# Patient Record
Sex: Male | Born: 1965 | Hispanic: Yes | Marital: Single | Smoking: Current every day smoker
Health system: Southern US, Community
[De-identification: ages and names within clinical notes are randomized; demographics above are authoritative.]

## PROBLEM LIST (undated history)

## (undated) DIAGNOSIS — Z5900 Homelessness unspecified: Secondary | ICD-10-CM

## (undated) DIAGNOSIS — F1010 Alcohol abuse, uncomplicated: Secondary | ICD-10-CM

---

## 2002-12-26 ENCOUNTER — Emergency Department (HOSPITAL_COMMUNITY): Admission: EM | Admit: 2002-12-26 | Discharge: 2002-12-27 | Payer: Self-pay | Admitting: Emergency Medicine

## 2007-08-22 ENCOUNTER — Emergency Department (HOSPITAL_COMMUNITY): Admission: EM | Admit: 2007-08-22 | Discharge: 2007-08-23 | Payer: Self-pay | Admitting: Emergency Medicine

## 2007-08-31 ENCOUNTER — Inpatient Hospital Stay (HOSPITAL_COMMUNITY): Admission: EM | Admit: 2007-08-31 | Discharge: 2007-09-03 | Payer: Self-pay | Admitting: Emergency Medicine

## 2010-06-03 NOTE — H&P (Signed)
NAMEGARRON, Jay [name]               ACCOUNT NO.:  [id number]   MEDICAL RECORD NO.:  [id number]          PATIENT TYPE:  INP   LOCATION:  1442                         FACILITY:  [facility]   PHYSICIAN:  Mobolaji B. Bakare, M.D.DATE OF BIRTH:  07/11/1965   DATE OF ADMISSION:  08/31/2007  DATE OF DISCHARGE:                              HISTORY & PHYSICAL   PRIMARY CARE PHYSICIAN:  Unassigned.   CHIEF COMPLAINT:  Too much alcohol.   HISTORY OF PRESENTING COMPLAINT:  Mr. Schreur is a 45 year old Hispanic  male who presents to the emergency room via ambulance.  The patient was  apparently found on [employer] by the EMS.  He was brought with the  chief complaint of abdominal pain in the epigastric area.  He admitted  to having been drinking heavy alcohol.  He has not been vomiting.  No  diarrhea or hematemesis or hematochezia.  The patient has an alcohol  level over 300.  His history is somewhat unreliable.  He tells me he has  not had any vomiting and he states that the reason for coming to the  hospital is because of too much drinking.  It should be noted that the  patient was just released from jail 2 days ago.   The patient had initial EKG done upon arrival which showed ST elevation  in lead V2 and code STEMI was called.  Cardiologist on call, Omar Person ,  ordered repeat EKG and getting cardiac panel.  Repeat EKG  showed no ST elevation in V2 and cardiac panel was normal.  The patient  denies any chest pain prior to arrival in the emergency room or here in  the emergency room.  He denies diaphoresis.  No shortness of breath,  palpitations or tingling sensation.   REVIEW OF SYSTEMS:  No fever, chills, cough, dysuria or urgency or  increased frequency of micturition.  Rest of review of systems as in the  HPI.   PAST MEDICAL HISTORY:  1. Alcohol abuse.  2. Tobacco abuse.   PAST SURGICAL HISTORY:  None.   CURRENT MEDICATIONS:  None.   ALLERGIES:  Denies any drug allergies.   SOCIAL HISTORY:  The patient is a Corporate investment banker.  He is homeless.  He drinks alcohol and smokes cigarettes.   FAMILY HISTORY:  Both parents are in [place].  He cannot recall any  health issues with his the parents or siblings.   PHYSICAL EXAMINATION:  VITAL SIGNS:  Temperature 97.6, blood pressure of  117/78, pulse of 93, respiratory rate of 20, oxygen saturation of 99%.  GENERAL:  On examination the patient is awake.  He is disoriented to  place and time.  He is not tremulous.  He is drowsy but arousable and  able to communicate.  HEENT:  Pupils equal, round and reactive to light.  No elevated JVD.  No  carotid bruit.  Mucous membranes moist.  LUNGS:  Clear clinically to auscultation.  CVS:  S1 and S2 regular.  ABDOMEN:  Not distended, soft, no tenderness..  No palpable  organomegaly.  Bowel sounds present.  EXTREMITIES:  No pedal edema or calf tenderness.  Dorsalis pedis pulses  palpable.  CNS:  No focal neurological deficit.   LABORATORY DATA:  White cells 4.4, hemoglobin 14, hematocrit 40.5,  platelets 215,000, alcohol level 343.  Sodium 143, potassium 3.4,  chloride 108, CO2 24, glucose 106, BUN 13, creatinine 0.99, calcium 8.8,  bilirubin 0.7, AST 71, ALT 86, total protein 6.6, albumin 3.9, lipase  28.  Urine drug screen positive for benzodiazepine and negative for  cocaine and cannabis.  Cardiac markers at the point of care normal  troponin 0.01, CK-MB 1.7, creatinine kinase 2.233.  Second set of  cardiac markers at the point of care also normal.  Abdominal x-ray shows  no acute findings.  Chest x-ray:  No acute findings.   ASSESSMENT AND PLAN:  This patient is a 45 year old Hispanic male who is  apparently homeless and was brought to the emergency room for an  epigastric pain and alcohol intoxication.  Noted to have abnormal EKG.  He will be admitted to telemetry for further evaluation.   ADMISSION DIAGNOSIS:  1. Alcohol intoxication.  2. Abnormal EKG.  3.  Abdominal pain (normal lipase).  4. Abnormal liver enzymes.  5. Tobacco abuse.  6. Homelessness.   PLAN:  Admit to telemetry floor.  Cycle cardiac enzymes.  Continue IV  fluid, normal saline.  Start multivitamin 1 tablet daily, folic acid 1  mg daily, thiamine 100 mg daily,  Protonix 40 mg IV.  The patient is at  risk for withdrawal.  Will watch.  He may need to start alcohol  withdrawal protocol soon.  Will check EKG in the morning.  Continue  aspirin 325 mg p.o. daily.  Check fasting lipid profile and hemoglobin  A1c.  Nicotine patch 31 mg daily.  I will offer counseling.  Will check  hepatitis B and C serology and ultrasound of the biliary tree and  gallbladder.  He had elevated transaminases in keeping with alcohol  pattern.      Mobolaji B. Corky Downs, M.D.  Electronically Signed     MBB/MEDQ  D:  08/31/2007  T:  09/01/2007  Job:  [id number]

## 2010-06-06 NOTE — Discharge Summary (Signed)
Jay [name], Jay [name] NO.:  [id number]   MEDICAL RECORD NO.:  [id number]          PATIENT TYPE:  INP   LOCATION:  1501                         FACILITY:  [facility]   PHYSICIAN:  Lonia Blood, M.D.      DATE OF BIRTH:  Sep 10, 1965   DATE OF ADMISSION:  08/31/2007  DATE OF DISCHARGE:  09/03/2007                               DISCHARGE SUMMARY   PRIMARY CARE PHYSICIAN:  [facility].   DISCHARGE DIAGNOSES:  1. Alcohol intoxication.  2. Alcoholic hepatitis.  3. Tobacco abuse.   DISCHARGE MEDICATIONS:  1. Thiamine 100 mg daily.  2. Folate 1 mg daily.  3. Ativan 1 mg every 8 hours as needed.   DISPOSITION:  The patient was discharged to home after completing the  CIWA protocol, was continued with some Ativan as needed as well as  thiamine folate.  He was counseled extensively about stopping drinking.   PROCEDURE PERFORMED:  During this hospitalization includes a head CT  without contrast that was essentially negative on admission.   CONSULTATIONS:  None.   BRIEF HISTORY AND PHYSICAL:  Please refer to dictated history and  physical by Dr. Jamison Oka.  In short however, the patient is a 45-  year-old Hispanic man that presented with alcohol intoxication.  Patient  is homeless at the time of admission and was found to be obtunded with  an alcohol level of 343.  He also had some mild EKG changes that were  nonspecific.  His urine drug screen showed benzodiazepines.  He has mild  elevation of his LFTs and mild hypokalemia with potassium of 3.4.  His  cardiac enzymes were normal except for a CK of 233.  The patient was  admitted with alcohol intoxication for further management.   HOSPITAL COURSE:  1. Alcohol intoxication.  The patient was placed on the CIWA protocol      using Ativan.  He went through withdrawal symptoms and signs, and      was subsequently discharged in stable condition.  He was given      resources for outpatient alcohol programs as  well as extensive      counseling in the hospital.  2. Alcoholic hepatitis.  The patient was negative for hepatitis B or      C.  It seems like his LFTs were elevated      mainly from alcohol.  He was again counseled extensively.  3. Tobacco abuse.  He was given some nicotine patches and counseled      extensively.  Otherwise the patient has no other medical issues at      the time of discharge.      Lonia Blood, M.D.  Electronically Signed     LG/MEDQ  D:  11/08/2007  T:  11/08/2007  Job:  [id number]

## 2010-10-17 LAB — CBC
Hemoglobin: 14
MCHC: 34.5
MCHC: 34.7
MCV: 97.7
Platelets: 215
Platelets: 272
RDW: 12.9
WBC: 6.3

## 2010-10-17 LAB — COMPREHENSIVE METABOLIC PANEL
ALT: 61 — ABNORMAL HIGH
AST: 34
AST: 44 — ABNORMAL HIGH
AST: 51 — ABNORMAL HIGH
Albumin: 3.1 — ABNORMAL LOW
Albumin: 3.5
Albumin: 3.8
Calcium: 7.5 — ABNORMAL LOW
Calcium: 8.2 — ABNORMAL LOW
Calcium: 9
Chloride: 105
Creatinine, Ser: 0.9
Creatinine, Ser: 0.95
Creatinine, Ser: 0.96
GFR calc Af Amer: >60
GFR calc Af Amer: >60
GFR calc Af Amer: >60
Sodium: 139
Sodium: 140
Total Protein: 5.4 — ABNORMAL LOW
Total Protein: 6.8

## 2010-10-17 LAB — CARDIAC PANEL(CRET KIN+CKTOT+MB+TROPI)
Relative Index: 0.9
Total CK: 163
Troponin I: 0.01
Troponin I: 0.01

## 2010-10-17 LAB — LIPID PANEL
HDL: 31 — ABNORMAL LOW
Triglycerides: 406 — ABNORMAL HIGH
VLDL: UNDETERMINED

## 2010-10-17 LAB — HEPATITIS PANEL, ACUTE
Hep B C IgM: NEGATIVE
Hepatitis B Surface Ag: NEGATIVE

## 2010-10-17 LAB — CK TOTAL AND CKMB (NOT AT ARMC)
CK, MB: 1.7
Relative Index: 0.7

## 2010-10-17 LAB — BASIC METABOLIC PANEL
BUN: 13
CO2: 24
Calcium: 8.8
Creatinine, Ser: 0.99
Glucose, Bld: 106 — ABNORMAL HIGH
Sodium: 143

## 2010-10-17 LAB — RAPID URINE DRUG SCREEN, HOSP PERFORMED
Amphetamines: NOT DETECTED
Barbiturates: NOT DETECTED
Benzodiazepines: NOT DETECTED
Benzodiazepines: POSITIVE — AB
Cocaine: NOT DETECTED
Tetrahydrocannabinol: NOT DETECTED

## 2010-10-17 LAB — POCT CARDIAC MARKERS
CKMB, poc: <1 — ABNORMAL LOW
CKMB, poc: <1 — ABNORMAL LOW
Myoglobin, poc: 54.3

## 2010-10-17 LAB — PROTIME-INR
INR: 1.1
Prothrombin Time: 14.1

## 2010-10-17 LAB — ETHANOL: Alcohol, Ethyl (B): 260 — ABNORMAL HIGH

## 2010-10-17 LAB — LIPASE, BLOOD: Lipase: 28

## 2010-10-17 LAB — HEPATIC FUNCTION PANEL
Albumin: 3.9
Total Bilirubin: 0.7

## 2011-08-11 ENCOUNTER — Encounter (HOSPITAL_COMMUNITY): Payer: Self-pay | Admitting: *Deleted

## 2011-08-11 DIAGNOSIS — R279 Unspecified lack of coordination: Secondary | ICD-10-CM | POA: Insufficient documentation

## 2011-08-11 DIAGNOSIS — F1010 Alcohol abuse, uncomplicated: Secondary | ICD-10-CM | POA: Insufficient documentation

## 2011-08-11 DIAGNOSIS — R404 Transient alteration of awareness: Secondary | ICD-10-CM | POA: Insufficient documentation

## 2011-08-11 NOTE — ED Notes (Signed)
Per EMS pt picked up at gas station; homeless; etoh; slurred speech

## 2011-08-12 ENCOUNTER — Telehealth (HOSPITAL_COMMUNITY): Payer: Self-pay | Admitting: Licensed Clinical Social Worker

## 2011-08-12 ENCOUNTER — Emergency Department (HOSPITAL_COMMUNITY)
Admission: EM | Admit: 2011-08-12 | Discharge: 2011-08-13 | Disposition: A | Discharge status: Other Acute Inpatient Hospital | Payer: Self-pay | Attending: Emergency Medicine | Admitting: Emergency Medicine

## 2011-08-12 DIAGNOSIS — F1010 Alcohol abuse, uncomplicated: Secondary | ICD-10-CM

## 2011-08-12 LAB — COMPREHENSIVE METABOLIC PANEL
BUN: 14 mg/dL (ref 6–23)
CO2: 25 mEq/L (ref 19–32)
Calcium: 8.7 mg/dL (ref 8.4–10.5)
Creatinine, Ser: 0.78 mg/dL (ref 0.50–1.35)
GFR calc Af Amer: >90 mL/min (ref >90)
GFR calc non Af Amer: >90 mL/min (ref >90)
Glucose, Bld: 87 mg/dL (ref 70–99)

## 2011-08-12 LAB — CBC
Hemoglobin: 14 g/dL (ref 13.0–17.0)
MCH: 33.2 pg (ref 26.0–34.0)
MCHC: 35.7 g/dL (ref 30.0–36.0)
MCV: 92.9 fL (ref 78.0–100.0)
RBC: 4.22 MIL/uL (ref 4.22–5.81)

## 2011-08-12 LAB — ETHANOL: Alcohol, Ethyl (B): 121 mg/dL — ABNORMAL HIGH (ref 0–11)

## 2011-08-12 MED ORDER — LORAZEPAM 1 MG PO TABS: 0.0000 mg | ORAL_TABLET | Freq: Two times a day (BID) | ORAL | Status: DC

## 2011-08-12 MED ORDER — IBUPROFEN 600 MG PO TABS: 600.0000 mg | ORAL_TABLET | Freq: Three times a day (TID) | ORAL | Status: DC | PRN

## 2011-08-12 MED ORDER — LORAZEPAM 1 MG PO TABS: 1.0000 mg | ORAL_TABLET | Freq: Four times a day (QID) | ORAL | Status: DC | PRN

## 2011-08-12 MED ORDER — ONDANSETRON HCL 4 MG PO TABS: 4.0000 mg | ORAL_TABLET | Freq: Three times a day (TID) | ORAL | Status: DC | PRN

## 2011-08-12 MED ORDER — FOLIC ACID 1 MG PO TABS
1.0000 mg | ORAL_TABLET | Freq: Every day | ORAL | Status: DC
  Administered 2011-08-12: 1 mg via ORAL
  Filled 2011-08-12: qty 1

## 2011-08-12 MED ORDER — LORAZEPAM 2 MG/ML IJ SOLN: 1.0000 mg | Freq: Four times a day (QID) | INTRAMUSCULAR | Status: DC | PRN

## 2011-08-12 MED ORDER — THIAMINE HCL 100 MG/ML IJ SOLN: 100.0000 mg | Freq: Every day | INTRAMUSCULAR | Status: DC

## 2011-08-12 MED ORDER — ADULT MULTIVITAMIN W/MINERALS CH
1.0000 | ORAL_TABLET | Freq: Every day | ORAL | Status: DC
  Administered 2011-08-12: 1 via ORAL
  Filled 2011-08-12: qty 1

## 2011-08-12 MED ORDER — LORAZEPAM 1 MG PO TABS
0.0000 mg | ORAL_TABLET | Freq: Four times a day (QID) | ORAL | Status: DC
  Administered 2011-08-12: 1 mg via ORAL
  Filled 2011-08-12: qty 1

## 2011-08-12 MED ORDER — VITAMIN B-1 100 MG PO TABS
100.0000 mg | ORAL_TABLET | Freq: Every day | ORAL | Status: DC
  Administered 2011-08-12: 100 mg via ORAL
  Filled 2011-08-12: qty 1

## 2011-08-12 MED ORDER — ALUM & MAG HYDROXIDE-SIMETH 200-200-20 MG/5ML PO SUSP: 30.0000 mL | ORAL | Status: DC | PRN

## 2011-08-12 NOTE — ED Notes (Signed)
Attempted to call report to psych ED to move pt, nurse stated that would call back.

## 2011-08-12 NOTE — ED Notes (Signed)
Pt states he lives on the [address], has no family here,  Closest relatives are in [place],  Pt has ETOH on board

## 2011-08-12 NOTE — BH Assessment (Signed)
Assessment Note   Jay [name] is an 46 y.o. male. Pt arrives to [facility] via EMS from local gas station with alcohol intoxication. Patient's ETOH upon arrival was 267.  Initial history limited by heavy alcohol use and somnolence. However, patient later reported that he homeless and has been for the past month. Patient reports mild depression and has turned to alcohol for comforting. Sts he has no family or friends nearby. He has drank heavily for the past 20 days. Pt drinking 6-7 40oz beers daily. His last drink was last night. Patient drank 6-7 beers. Patient denies withdrawal symptoms at this time. EDP notes that patient reported history of withdrawal seizures. During the assessment patient denies any type of seizure history, DT's, and/or blackouts. He denies drug use. No history of in-pt or out-pt treatment. No SI, HI, and current AVH's. Patient admits to a history of auditory hallucinations (no command). However has not heard voices in 4-5 days. Patient unable to describe auditory hallucinations.   Patient sts that he would like inpatient detox. He appears motivated for treatment. Writer will ask on-coming staff to follow up with placement options ([facility] and/or [facility]).    Axis I: Alcohol Dependence Axis II: Deferred Axis III: No past medical history on file. Axis IV: economic problems, housing problems, occupational problems, other psychosocial or environmental problems, problems related to social environment, problems with access to health care services and problems with primary support group Axis V: 41-50 serious symptoms  Past Medical History: No past medical history on file.  No past surgical history on file.  Family History: No family history on file.  Social History:  does not have a smoking history on file. He does not have any smokeless tobacco history on file. He reports that he drinks alcohol. His drug history not on file.  Additional Social History:  Alcohol / Drug Use Pain  Medications: patient denies Prescriptions: patient denies Over the Counter: patient denies History of alcohol / drug use?: Yes Substance #1 Name of Substance 1: Alcohol-beer 1 - Age of First Use: 8 or 46 yrs old  1 - Amount (size/oz): (6-7) 40oz beers 1 - Frequency: daily for the past 20 days 1 - Duration: 20 days 1 - Last Use / Amount: 08/11/2011; "last night"; pt drank approx. 6-7 beers  CIWA:   COWS:    Allergies: No Known Allergies  Home Medications:  (Not in a hospital admission)  OB/GYN Status:  No LMP for male patient.  General Assessment Data Location of Assessment: [facility] ED Living Arrangements: Other (Comment) (patient is homeless) Can pt return to current living arrangement?: Yes Admission Status: Voluntary Is patient capable of signing voluntary admission?: Yes Transfer from: Acute Hospital Referral Source: Other (pr brought to the ED by EMS)     Risk to self Suicidal Ideation: No Suicidal Intent: No Is patient at risk for suicide?: No Suicidal Plan?: No Access to Means: No What has been your use of drugs/alcohol within the last 12 months?:  (alcohol; no drug use) Previous Attempts/Gestures: No How many times?:  (0) Other Self Harm Risks:  (none reported) Triggers for Past Attempts: Other (Comment) (n/a) Intentional Self Injurious Behavior: None Family Suicide History: No Recent stressful life event(s): Other (Comment) (homeless x1 mo; finances; no supports/local family) Persecutory voices/beliefs?: No Depression: No Depression Symptoms:  (none reported) Substance abuse history and/or treatment for substance abuse?: No Suicide prevention information given to non-admitted patients: Not applicable  Risk to Others Homicidal Ideation: No Thoughts of Harm to Others:  No Current Homicidal Intent: No Current Homicidal Plan: No Access to Homicidal Means: No Identified Victim:  (none reported) History of harm to others?: No Assessment of Violence: None  Noted Violent Behavior Description:  (Patient is calm and cooperative. ) Does patient have access to weapons?: No Criminal Charges Pending?: No Does patient have a court date: No  Psychosis Hallucinations: Auditory ("Sometimes I hear voices last heard voices 3 days ago") Delusions:  (none reported; occas. aud halluc's; no command)  Mental Status Report Appear/Hygiene: Disheveled Eye Contact: Good Motor Activity: Other (Comment) (normal) Speech: Logical/coherent Level of Consciousness: Alert Mood: Other (Comment) (appropriate) Affect: Unable to Assess (flat) Anxiety Level: None Thought Processes: Coherent;Relevant Judgement: Unimpaired Orientation: Person;Place;Time;Situation Obsessive Compulsive Thoughts/Behaviors: None  Cognitive Functioning Concentration: Normal Memory: Recent Intact;Remote Intact IQ: Average Insight: Fair Impulse Control: Fair Appetite: Fair Weight Loss:  (none reported) Weight Gain:  (none reported) Sleep: No Change Total Hours of Sleep:  (2-3 hrs per night) Vegetative Symptoms: None  ADLScreening [facility] Assessment Services) Patient's cognitive ability adequate to safely complete daily activities?: Yes Patient able to express need for assistance with ADLs?: Yes Independently performs ADLs?: Yes  Abuse/Neglect [facility]) Physical Abuse: Denies Verbal Abuse: Denies Sexual Abuse: Denies  Prior Inpatient Therapy Prior Inpatient Therapy: No Prior Therapy Dates:  (n/a) Prior Therapy Facilty/Provider(s):  (n/a) Reason for Treatment:  (n/a)  Prior Outpatient Therapy Prior Outpatient Therapy: No Prior Therapy Dates:  (n/a) Prior Therapy Facilty/Provider(s):  (n/a) Reason for Treatment:  (n/a)  ADL Screening (condition at time of admission) Patient's cognitive ability adequate to safely complete daily activities?: Yes Patient able to express need for assistance with ADLs?: Yes Independently performs ADLs?: Yes Weakness of Legs: None Weakness of  Arms/Hands: None  Home Assistive Devices/Equipment Home Assistive Devices/Equipment: None    Abuse/Neglect Assessment (Assessment to be complete while patient is alone) Physical Abuse: Denies Verbal Abuse: Denies Sexual Abuse: Denies Exploitation of patient/patient's resources: Denies Self-Neglect: Denies Values / Beliefs Cultural Requests During Hospitalization: None Spiritual Requests During Hospitalization: None Consults Spiritual Care Consult Needed: No Social Work Consult Needed: No Merchant navy officer (For Healthcare) Advance Directive: Patient does not have advance directive Nutrition Screen Diet: Regular  Additional Information 1:1 In Past 12 Months?: No CIRT Risk: No Elopement Risk: No Does patient have medical clearance?: Yes     Disposition:  Disposition Disposition of Patient: Other dispositions;Referred to (Pending referral to [facility] and/or [facility])  On Site Evaluation by:   Reviewed with Physician:     Melynda Ripple [facility] 08/12/2011 7:25 PM

## 2011-08-12 NOTE — ED Notes (Signed)
MD at bedside. 

## 2011-08-12 NOTE — ED Notes (Signed)
Pt. Belonging is in locker#37(1bag)

## 2011-08-12 NOTE — ED Provider Notes (Signed)
History     CSN: [id number]  Arrival date & time 08/11/11  2345   First MD Initiated Contact with Patient 08/12/11 0144      Chief Complaint  Patient presents with  . Alcohol Intoxication    (Consider location/radiation/quality/duration/timing/severity/associated sxs/prior treatment) HPI 46 yo male presents to the ER via EMS [place] gas station with alcohol intoxication.  Initial history limited by heavy alcohol use and somnolence.  Pt reports being homeless, no family or friends nearby.  Pt reassess after several hours of observation and sleeping.  Pt more arousable after time, and reports he knows he has a problem with alcohol and would like inpatient help with detox.  Pt denies any medical problems.  He reports remote history of withdrawal seizures, and reports he does get shaky currently when he doesn't drink.  Unknown last prolonged sobriety.  He denies any h/o depression, anxiety, no SI/HI.  History reviewed. No pertinent past medical history.  History reviewed. No pertinent past surgical history.  No family history on file.  History  Substance Use Topics  . Smoking status: Not on file  . Smokeless tobacco: Not on file  . Alcohol Use: Yes      Review of Systems  Unable to perform ROS: Other  alcohol intoxication  Allergies  Review of patient's allergies indicates no known allergies.  Home Medications  No current outpatient prescriptions on file.  BP 129/66  Pulse 75  Temp 98 F (36.7 C) (Oral)  Resp 18  SpO2 97%  Physical Exam  Nursing note and vitals reviewed. Constitutional: He appears well-developed and well-nourished.       somnloent but arousable  HENT:  Head: Normocephalic and atraumatic.  Nose: Nose normal.  Mouth/Throat: Oropharynx is clear and moist.  Eyes: Conjunctivae and EOM are normal. Pupils are equal, round, and reactive to light.       Nystagmus noted  Neck: Normal range of motion. Neck supple. No JVD present. No tracheal deviation  present. No thyromegaly present.  Cardiovascular: Normal rate, regular rhythm, normal heart sounds and intact distal pulses.  Exam reveals no gallop and no friction rub.   No murmur heard. Pulmonary/Chest: Effort normal and breath sounds normal. No stridor. No respiratory distress. He has no wheezes. He has no rales. He exhibits no tenderness.  Abdominal: Soft. Bowel sounds are normal. He exhibits no distension and no mass. There is no tenderness. There is no rebound and no guarding.  Musculoskeletal: Normal range of motion. He exhibits no edema and no tenderness.  Lymphadenopathy:    He has no cervical adenopathy.  Neurological: He has normal reflexes. He exhibits normal muscle tone. Coordination (mild ataxia) abnormal.       somnolent  Skin: Skin is dry. No rash noted. No erythema. No pallor.  Psychiatric: He has a normal mood and affect. His behavior is normal. Judgment and thought content normal.    ED Course  Procedures (including critical care time)  Labs Reviewed  ETHANOL - Abnormal; Notable for the following:    Alcohol, Ethyl (B) 267 (*)     All other components within normal limits  COMPREHENSIVE METABOLIC PANEL - Abnormal; Notable for the following:    Potassium 3.4 (*)     All other components within normal limits  ETHANOL - Abnormal; Notable for the following:    Alcohol, Ethyl (B) 121 (*)     All other components within normal limits  CBC  URINE RAPID DRUG SCREEN (HOSP PERFORMED)  1. Alcohol abuse       MDM  46 yo male with alcohol abuse seeking detox.  Have d/w act team who will see the patient.  Holding orders with CIWA placed.        Olivia Mackie, MD 08/12/11 [phone]

## 2011-08-12 NOTE — ED Notes (Signed)
Pt stating he wants help. States that he would like to go to rehab facility. MD notified.

## 2011-08-12 NOTE — ED Notes (Signed)
Report given to Nicole RN

## 2011-08-12 NOTE — BHH Counselor (Addendum)
Pt has been accepted to [facility] by Dr. Lolly Mustache to room 300-2 EDP has been notified and agrees with plan. Support paperwork has been signed by pt and faxed to [facility].

## 2011-08-12 NOTE — ED Notes (Signed)
Pt attended [facility] ED group facilitated by Wilkie Aye, MDiv  Group focused on sources of strength and pt's worked to [employer] of strength in their lives.  Pt spoke little throughout group, but responded to questions from other members and prompts from facilitator.  Pt described to group history of ETOH and desire to cease drinking.  Pt described having family in [place] who are supportive of him, but having little resources in [place] area.    Belva Crome MDiv, Chaplain

## 2011-08-13 ENCOUNTER — Inpatient Hospital Stay (HOSPITAL_COMMUNITY)
Admission: AD | Admit: 2011-08-13 | Discharge: 2011-08-17 | DRG: 897 | Disposition: A | Discharge status: Home / Self Care | Payer: Federal, State, Local not specified - Other | Source: Ambulatory Visit | Attending: Psychiatry | Admitting: Psychiatry

## 2011-08-13 ENCOUNTER — Encounter (HOSPITAL_COMMUNITY): Payer: Self-pay | Admitting: *Deleted

## 2011-08-13 DIAGNOSIS — F1020 Alcohol dependence, uncomplicated: Secondary | ICD-10-CM | POA: Diagnosis present

## 2011-08-13 DIAGNOSIS — F10939 Alcohol use, unspecified with withdrawal, unspecified: Principal | ICD-10-CM | POA: Diagnosis present

## 2011-08-13 DIAGNOSIS — E876 Hypokalemia: Secondary | ICD-10-CM | POA: Diagnosis present

## 2011-08-13 DIAGNOSIS — F10239 Alcohol dependence with withdrawal, unspecified: Principal | ICD-10-CM | POA: Diagnosis present

## 2011-08-13 MED ORDER — NICOTINE 21 MG/24HR TD PT24
21.0000 mg | MEDICATED_PATCH | Freq: Every day | TRANSDERMAL | Status: DC
  Filled 2011-08-13: qty 1

## 2011-08-13 MED ORDER — CHLORDIAZEPOXIDE HCL 25 MG PO CAPS
25.0000 mg | ORAL_CAPSULE | Freq: Every day | ORAL | Status: AC
  Administered 2011-08-17: 25 mg via ORAL
  Filled 2011-08-13: qty 1

## 2011-08-13 MED ORDER — ACETAMINOPHEN 325 MG PO TABS: 650.0000 mg | ORAL_TABLET | Freq: Four times a day (QID) | ORAL | Status: DC | PRN

## 2011-08-13 MED ORDER — CHLORDIAZEPOXIDE HCL 25 MG PO CAPS: 25.0000 mg | ORAL_CAPSULE | Freq: Four times a day (QID) | ORAL | Status: AC | PRN

## 2011-08-13 MED ORDER — ADULT MULTIVITAMIN W/MINERALS CH
1.0000 | ORAL_TABLET | Freq: Every day | ORAL | Status: DC
  Administered 2011-08-13 – 2011-08-17 (×5): 1 via ORAL
  Filled 2011-08-13 (×6): qty 1

## 2011-08-13 MED ORDER — HYDROXYZINE HCL 25 MG PO TABS: 25.0000 mg | ORAL_TABLET | Freq: Four times a day (QID) | ORAL | Status: AC | PRN

## 2011-08-13 MED ORDER — MAGNESIUM HYDROXIDE 400 MG/5ML PO SUSP: 30.0000 mL | Freq: Every day | ORAL | Status: DC | PRN

## 2011-08-13 MED ORDER — ONDANSETRON 4 MG PO TBDP: 4.0000 mg | ORAL_TABLET | Freq: Four times a day (QID) | ORAL | Status: AC | PRN

## 2011-08-13 MED ORDER — CHLORDIAZEPOXIDE HCL 25 MG PO CAPS
25.0000 mg | ORAL_CAPSULE | ORAL | Status: AC
  Administered 2011-08-16: 25 mg via ORAL
  Filled 2011-08-13 (×3): qty 1

## 2011-08-13 MED ORDER — CHLORDIAZEPOXIDE HCL 25 MG PO CAPS
25.0000 mg | ORAL_CAPSULE | Freq: Four times a day (QID) | ORAL | Status: AC
  Administered 2011-08-13 – 2011-08-14 (×6): 25 mg via ORAL
  Filled 2011-08-13 (×6): qty 1

## 2011-08-13 MED ORDER — ALUM & MAG HYDROXIDE-SIMETH 200-200-20 MG/5ML PO SUSP: 30.0000 mL | ORAL | Status: DC | PRN

## 2011-08-13 MED ORDER — VITAMIN B-1 100 MG PO TABS
100.0000 mg | ORAL_TABLET | Freq: Every day | ORAL | Status: DC
  Administered 2011-08-14 – 2011-08-17 (×4): 100 mg via ORAL
  Filled 2011-08-13 (×5): qty 1

## 2011-08-13 MED ORDER — LOPERAMIDE HCL 2 MG PO CAPS: 2.0000 mg | ORAL_CAPSULE | ORAL | Status: AC | PRN

## 2011-08-13 MED ORDER — CHLORDIAZEPOXIDE HCL 25 MG PO CAPS
25.0000 mg | ORAL_CAPSULE | Freq: Three times a day (TID) | ORAL | Status: AC
  Administered 2011-08-14 – 2011-08-15 (×3): 25 mg via ORAL
  Filled 2011-08-13 (×2): qty 1

## 2011-08-13 MED ORDER — ENSURE COMPLETE PO LIQD
237.0000 mL | Freq: Every day | ORAL | Status: DC
  Administered 2011-08-14: 237 mL via ORAL

## 2011-08-13 MED ORDER — THIAMINE HCL 100 MG/ML IJ SOLN
100.0000 mg | Freq: Once | INTRAMUSCULAR | Status: DC
Start: 2011-08-13 — End: 2011-08-17

## 2011-08-13 NOTE — Progress Notes (Signed)
D: Pt denies SI/HI/AV. Pt is pleasant and cooperative. Pt has an interpretor who comes when he is in group. Pt only complaint was he did not understand what medications he was on.   A: Pt was offered support and encouragement. Pt was given scheduled medications. Pt was encourage to attend groups. RN when pt's interpretor can was able to explain to the pt what medications he is on and what they are for.Q 15 minute checks were done for safety.  R:Pt attends groups. Pt is taking medication. Pt has no complaints.Pt stated he felt good that the interpretor was here and he could get his questions answered. Pt receptive to treatment and safety maintained on unit.

## 2011-08-13 NOTE — Progress Notes (Signed)
08/13/2011         Time: 1500      Group Topic/Focus: The focus of this group is on enhancing the patient's understanding of leisure, barriers to leisure, and the importance of engaging in positive leisure activities upon discharge for improved total health.  Participation Level: None  Participation Quality: Attentive  Affect: Blunted  Cognitive: Oriented   Additional Comments: Patient introduced himself, then sat quietly the remainder of group.   Zahra Peffley 08/13/2011 3:44 PM 

## 2011-08-13 NOTE — Discharge Planning (Signed)
New patient attended AM group; fairly good communication skills in [place].  States he is here due to alcohol dependence.  Went through detox in [place] about a year ago, and was sober for 4 months.  Has no supports here, but has a cousin in [place].  Would like to get to [facility] where he knows he can stay with cousin.  Has no phone number for cousin, has no ID-lost wallet.  As a consequence, unable to work.

## 2011-08-13 NOTE — Progress Notes (Signed)
Brief Nutrition Note  Reason: Nutrition Risk for unintentional weight loss > 10 lb over 1 month.    Patient reported his appetite and intake are better today than they have been. He reported he would only eat two meals a days PTA because he drank a lot of alcohol. He reported he does not wan to drink alcohol anymore.   Wt Readings from Last 10 Encounters:  08/13/11 131 lb 8 oz (59.648 kg)   I have encouraged the patient to eat regular meals daily. We discussed snacks that are good sources of protein. He asked good questions about nutrition and was without any further nutrition related questions. He stated that he would like to try Ensure nutrition supplement.   Will order patient Ensure nutrition supplement once daily. Provides 250 kcal and 9 grams of protein.   Recommend provide patient snack of cheese and crackers or yogurt at snack times.   RD available for nutrition needs.   Iven Finn [facility] [phone]

## 2011-08-13 NOTE — Progress Notes (Signed)
Patient ID: Jay [name], male   DOB: Feb 16, 1965, 46 y.o.   MRN: [id number] This patient went to karaoke with an interpreter tonight.

## 2011-08-13 NOTE — Progress Notes (Signed)
Patient ID: Jay [name], male   DOB: 05/15/1965, 46 y.o.   MRN: [id number]  Pt was pleasant and cooperative during the adm process. Pt is originally from [place] and speaks and understands some [place]. However even though pt stated he had no problems reading or writing;it should be noted that he was not able to read the treatment agreement. When asked to describe the circumstances surrounding his adm pt stated, "I've been drinking too much". Pt admits to drinking six to seven 24 oz beers daily and was found at a gas station, intoxicated. Pt is homeless, and plans to return to [place] to live with cousins. Stated he came to [facility], "looking for work".  Pt has a blister under the great toe on his left foot, and 2nd toe on the right. Stated, "from walking too much".  Pt denies SI, HI, A/V.

## 2011-08-13 NOTE — B[facility] Counselor (Signed)
Adult Comprehensive Assessment  Patient ID: Jay [name], male   DOB: [birth year], 46 y.o.   MRN: [id number]  Information Source: Information source: Patient  Current Stressors:  Educational / Learning stressors: 6th grade education Employment / Job issues: unemployed Family Relationships: most of his family is in [place] Financial / Lack of resources (include bankruptcy): no income Housing / Lack of housing: homeless Physical health (include injuries & life threatening diseases): no issues reported Social relationships: no social support Substance abuse: drinks daily-6 40oz. beers for the last 20 days Bereavement / Loss: none reported  Living/Environment/Situation:  Living Arrangements: Other (Comment) (shelter, friends, and on the streets) Living conditions (as described by patient or guardian): homeless for one month, prior to this, he went from place to place with friends How long has patient lived in current situation?: at least one month What is atmosphere in current home: Dangerous  Family History:  Marital status: Single Does patient have children?: No  Childhood History:  By whom was/is the patient raised?: Mother Additional childhood history information: grew up in [place] where mother still resides Description of patient's relationship with caregiver when they were a child: good Patient's description of current relationship with people who raised him/her: he sometimes calls her Does patient have siblings?: Yes Number of Siblings: 7  (3 brothers, 4 sisters) Description of patient's current relationship with siblings: all in [place], good Did patient suffer any verbal/emotional/physical/sexual abuse as a child?: No Did patient suffer from severe childhood neglect?: No Has patient ever been sexually abused/assaulted/raped as an adolescent or adult?: No Was the patient ever a victim of a crime or a disaster?: No How has this effected patient's relationships?: n/a Witnessed  domestic violence?: No Has patient been effected by domestic violence as an adult?: No  Education:  Highest grade of school patient has completed: 6th grade Currently a student?: No Learning disability?: No  Employment/Work Situation:   Employment situation: Unemployed What is the longest time patient has a held a job?: 1 year 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 beer, for 20 days If attempted suicide, did drugs/alcohol play a role in this?: No Alcohol/Substance Abuse Treatment Hx: Past Tx, Inpatient If yes, describe treatment: in [place], couldn't remember where. A long time ago Has alcohol/substance abuse ever caused legal problems?: No  Social Support System:   Forensic psychologist System: None Type of faith/religion: none How does patient's faith help to cope with current illness?: n/a  Leisure/Recreation:   Leisure and Hobbies: walking all day-shelter at night  Strengths/Needs:   What things does the patient do well?: none identified In what areas does patient struggle / problems for patient: none identified  Discharge Plan:   Does patient have access to transportation?: No Plan for no access to transportation at discharge: bus Will patient be returning to same living situation after discharge?: No Plan for living situation after discharge: may go to live with cousins in [place], but has no phone numbers Currently receiving community mental health services: No If no, would patient like referral for services when discharged?: Yes (What county?) ([place], [place]) Does patient have financial barriers related to discharge medications?: Yes Patient description of barriers related to  discharge medications: no income  Summary/Recommendations:   Summary and Recommendations (to be completed by the evaluator): Patient is a 46 year old male with diagnosis of Alcohol Dependence. Patient was intoxicated at a gas station. He has been depresses related to homelessness and no income. He has been self-medicating by drinking daily 6-7 40 oz . He is requesting detox. Patient would benefit from crisis stabilization, medication evaluation, group therapy, and psychoeducation groups to work on coping skills, case management for referrals.  Heinrich Fertig. 08/13/2011

## 2011-08-13 NOTE — H&P (Signed)
Psychiatric Admission Assessment Adult   Patient Identification:  Jay [name]  Date of Evaluation:  08/13/2011  Chief Complaint:  Alcohol Dependence  History of Present Illness: Pt is a 46 yo unmarried Hispanic male who presented through the [facility] ED for detox off alcohol.  He is currently homeless, unemployed and lost all of his "papers".  Pt has been drinking until complete intoxication daily.  He denied any hx of w/d seizures, complications or DTs.  Denied all other prescription and illicit drug use.  Further denied any acute safety concerns.         Past Psychiatric History: none.  Past Medical History:  No past medical history on file. Pt currently mildly hypokalemic.    Allergies:  No Known Allergies  PTA Medications: No prescriptions prior to admission    Previous Psychotropic Medications: none.  Substance Abuse History in the last 12 months: daily use of alcohol.  Denied any other prescription or illicit drug use.    Consequences of Substance Abuse: lost all of his papers/wallet and has no ID.  Social History:  Unemployed; unmarried; no children; cousin in [place]  Family History:  No family history on file.  Mental Status Examination/Evaluation: Patient seen and evaluated. Chart reviewed. Patient stated that his mood was "good". His affect was mood congruent and euthymic. He denied any current thoughts of self injurious behavior, suicidal ideation or homicidal ideation. He denied any significant depressive signs or symptoms at this time. There were no auditory or visual hallucinations, paranoia, delusional thought processes, or mania noted.  Thought process was linear and goal directed.  No psychomotor agitation or retardation was noted. His speech was normal rate, tone and volume. Eye contact was good. Judgment and insight are fair.  Patient has been up and engaged on the unit.  No acute safety concerns reported from team.  Pt agreeable to detox off alcohol.  No acute medical  or psychiatric issues noted.  Laboratory/X-Ray Psychological Evaluation(s)  See chart.    Assessment:  Alcohol Use & W/D Disorder; Hypokalemia   Treatment Plan/Recommendations: Pt admitted for crisis stabilization, detox off alcohol and treatment.  Please see orders.  Repeat K pending.  Pt seen and evaluated with interpreter.   Medications reviewed with pt and medication education provided. Will continue q15 minute checks per unit protocol.  No clinical indication for one on one level of observation at this time.  Pt contracting for safety.  Mental health treatment, medication management and continued sobriety will mitigate against the potential increased risk of harm to self and/or others.  Discussed the importance of recovery with pt, as well as, tools to move forward in a healthy & safe manner.  Pt agreeable with the plan.  Discussed with the team.   Current Medications:    . chlordiazePOXIDE  25 mg Oral QID   Followed by  . chlordiazePOXIDE  25 mg Oral TID   Followed by  . chlordiazePOXIDE  25 mg Oral BH-qamhs   Followed by  . chlordiazePOXIDE  25 mg Oral Daily  . feeding supplement  237 mL Oral QPC lunch  . multivitamin with minerals  1 tablet Oral Daily  . thiamine  100 mg Intramuscular Once  . thiamine  100 mg Oral Daily     Lupe Carney 7/25/201312:43 PM

## 2011-08-13 NOTE — Progress Notes (Signed)
[facility] Group Notes:  (Counselor/Nursing/MHT/Case Management/Adjunct)  08/13/2011 4:11 PM  Type of Therapy:  Group Therapy from 1:15 to 2:30  Participation Level:  Active  Participation Quality:  Appropriate, Attentive and Sharing  Affect:  Appropriate  Cognitive:  Alert, Appropriate and Oriented  Insight:  Good  Engagement in Group:  Good  Engagement in Therapy:  Good  Modes of Intervention:  Activity, Clarification and Support  Summary of Progress/Problems:  Jay [name]  participated in activity in which patients choose photographs to represent what their life would look and feel like were it in balance and another for out of balance. Jay [name] choose a photo of the interior of a chapel  to represent balance; and a photo of chained, locked doors to represent out of balance.  He shared "the alcohol is like these locked doors which keep me isolated and away from God.  "I don't do well away from God, I loose hope that I can even get back close again; it is a painful way to be."  Patient began group with out an interpreter but presented with more affect once joined by interpreter.   Clide Dales

## 2011-08-13 NOTE — Tx Team (Addendum)
Initial Interdisciplinary Treatment Plan  PATIENT STRENGTHS: (choose at least two) Ability for insight Motivation for treatment/growth Physical Health Work skills  PATIENT STRESSORS: Financial difficulties Substance abuse   PROBLEM LIST: Problem List/Patient Goals Date to be addressed Date deferred Reason deferred Estimated date of resolution  "I need to stop drinking" 08/13/11     "Help to get back to family" 08/13/11           Alcohol abuse 08/13/11     Increased risk for suicide 08/13/11                              DISCHARGE CRITERIA:  Ability to meet basic life and health needs Adequate post-discharge living arrangements Improved stabilization in mood, thinking, and/or behavior Motivation to continue treatment in a less acute level of care Need for constant or close observation no longer present Reduction of life-threatening or endangering symptoms to within safe limits Safe-care adequate arrangements made Verbal commitment to aftercare and medication compliance Withdrawal symptoms are absent or subacute and managed without 24-hour nursing intervention  PRELIMINARY DISCHARGE PLAN: Attend 12-step recovery group Outpatient therapy Participate in family therapy Placement in alternative living arrangements  PATIENT/FAMIILY INVOLVEMENT: This treatment plan has been presented to and reviewed with the patient, Jay [name], and/or family member.  The patient and family have been given the opportunity to ask questions and make suggestions.  [name] 08/13/2011, 2:18 AM

## 2011-08-13 NOTE — Progress Notes (Signed)
Psychoeducational Group Note  Date:  08/13/2011 Time:  1100  Group Topic/Focus:  Overcoming Stress:   The focus of this group is to define stress and help patients assess their triggers.  Participation Level:  Active  Participation Quality:  Appropriate  Affect:  Appropriate  Cognitive:  Appropriate  Insight:  Good  Engagement in Group:  Good  Additional Comments:  none  Ormond Lazo M 08/13/2011, 1:01 PM

## 2011-08-14 NOTE — Progress Notes (Signed)
Patient ID: Jay [name], male   DOB: 1965-03-06, 46 y.o.   MRN: [id number]  Problem: Alcohol Dependence  D: Pt withdrawn with dull, flat affect endorsing depression. Pt with minimal interaction.  A: Monitor patient Q 15 minutes for safety, encourage staff/peer interaction and group participation. Administer medications as ordered by MD.  R: Pt compliant with medications and attended group session, but remains withdrawn. Pt denies SI or plans to harm himself. Pt with 0 score on CIWA scale. Will continue to monitor pt for safety.

## 2011-08-14 NOTE — Progress Notes (Signed)
[facility] MD Progress Note  08/14/2011 9:20 PM  S/O: Patient seen and evaluated with interpreter. Chart reviewed. Patient stated that his mood was "good". His affect was mood congruent and euthymic. He denied any current thoughts of self injurious behavior, suicidal ideation or homicidal ideation. He denied any significant depressive signs or symptoms at this time. There were no auditory or visual hallucinations, paranoia, delusional thought processes, or mania noted. Thought process was linear and goal directed. No psychomotor agitation or retardation was noted. His speech was normal rate, tone and volume. Eye contact was good. Judgment and insight are fair. Patient has been up and engaged on the unit. No acute safety concerns reported from team. Pt agreeable to detox off alcohol. No acute medical or psychiatric issues noted.   Sleep:  Number of Hours: 6.5    Vital Signs:Blood pressure 125/87, pulse 68, temperature 98.2 F (36.8 C), temperature source Oral, resp. rate 16, height 5\' 3"  (1.6 m), weight 59.648 kg (131 lb 8 oz).  Current Medications:     . chlordiazePOXIDE  25 mg Oral QID   Followed by  . chlordiazePOXIDE  25 mg Oral TID   Followed by  . chlordiazePOXIDE  25 mg Oral BH-qamhs   Followed by  . chlordiazePOXIDE  25 mg Oral Daily  . feeding supplement  237 mL Oral QPC lunch  . multivitamin with minerals  1 tablet Oral Daily  . thiamine  100 mg Intramuscular Once  . thiamine  100 mg Oral Daily    Lab Results: No results found for this or any previous visit (from the past 48 hour(s)).  A/P: Alcohol Use & W/D Disorder; Hypokalemia   Pt admitted for crisis stabilization, detox off alcohol and treatment. Please see orders. Repeat K pending. Pt seen and evaluated with interpreter. Medications reviewed with pt and medication education provided. Will continue q15 minute checks per unit protocol. No clinical indication for one on one level of observation at this time. Pt contracting for  safety. Mental health treatment, medication management and continued sobriety will mitigate against the potential increased risk of harm to self and/or others. Discussed the importance of recovery with pt, as well as, tools to move forward in a healthy & safe manner. Pt agreeable with the plan. Discussed with the team.  Pt will need bus ticket to [place], can not take the train w/o ID.  Family located there with backup "papers" needed for ID.   Lupe Carney 08/14/2011, 9:20 PM

## 2011-08-14 NOTE — Progress Notes (Signed)
D:  Pt. about on the unit this am.  He has a flat affect with depressed mood.but does smile on occasion.  He is attending groups and interpreter is assisting with the language barrier.  He denies SI/HI upon questioning and denies withdrawal symptoms.  A"  Rn offered support and encouragement.  Given medications as prescribed.  Rn explained to pt to seek out Rn if he had problems or concerns.  He voiced understanding.  R: Receptive to staff.  Remains on q 15 minute  checks.  Will continue to monitor.

## 2011-08-14 NOTE — Discharge Planning (Signed)
This AM in group [place] states that he will be here thru the weekend per Dr.  He is OK with that.  Wants to return to [facility] but has no resources.  Cost for bus is $31.00.  Will see if [facility] system is willing to pay for ticket for 1:15 bus on Monday.

## 2011-08-14 NOTE — Progress Notes (Signed)
Patient ID: Jay [name], male   DOB: [birth year], 46 y.o.   MRN: [id number]  Problem: Alcohol Dependence  D: Pt out in milieu but is withdrawn with minimal interaction. Pt eating snacks and is cooperative.  A: Monitor Q 15 minutes for safety, encourage staff/peer interaction and group participation. Administer medications as ordered by MD.  R: Pt attended group and participated with assistance of interpreter. Pt endorses depression with dull, flat affect but currently denies SI or plans to harm himself. Will continue to monitor pt Q 15 minutes.

## 2011-08-14 NOTE — Treatment Plan (Signed)
Interdisciplinary Treatment Plan Update (Adult)  Date: 08/14/2011  Time Reviewed: 1:57 PM   Progress in Treatment: Attending groups: Yes Participating in groups: Yes Taking medication as prescribed: Yes Tolerating medication: Yes   Family/Significant other contact made:  no Patient understands diagnosis:  Yes  As evidenced by asking for help with alcohol detox Discussing patient identified problems/goals with staff:  Yes  See below Medical problems stabilized or resolved:  Yes Denies suicidal/homicidal ideation: Yes  In AM group Issues/concerns per patient self-inventory:  None noted Other:  New problem(s) identified: N/A  Reason for Continuation of Hospitalization: Medication stabilization Withdrawal symptoms  Interventions implemented related to continuation of hospitalization:   Additional comments:  Estimated length of stay:  Discharge Plan:  New goal(s): N/A  Review of initial/current patient goals per problem list:   1.  Goal(s):Safely detox from alcohol  Met:  No  Target date:7/29  As evidenced WU:[id number] vitals, CIWA score of 0  2.  Goal (s): Find a way to get to [facility] where family live  Met:  No  Target date:7/29  As evidenced by:CM to ask administration about securing bus fare  3.  Goal(s):Eliminate SI  Met:  Yes  Target date:7/26  As evidenced by:Gay denies SI  4.  Goal(s):Identify comprehensive sobriety plan  Met:  Yes  Target date:7/26  As evidenced YN:WGNF report of planning to go to AA mtgs  Attendees: Patient:     Family:     Physician:  Lupe Carney 08/14/2011 1:57 PM   Nursing:    08/14/2011 1:57 PM   Case Manager:  Richelle Ito, LCSW 08/14/2011 1:57 PM   Counselor:   08/14/2011 1:57 PM   Other:     Other:     Other:     Other:      Scribe for Treatment Team:   Ida Rogue, 08/14/2011 1:57 PM

## 2011-08-14 NOTE — Progress Notes (Signed)
[facility] Group Notes:  (Counselor/Nursing/MHT/Case Management/Adjunct)  08/14/2011 1:43 PM  Type of Therapy:  Psychoeducational Skills  Participation Level:  Active  Participation Quality:  Appropriate  Affect:  Appropriate  Cognitive:  Appropriate  Engagement in Group:  Good  Modes of Intervention:  Activity  Summary of Progress/Problems: Pt attended therapeutic activity of "Coping Skills Pictionary".    Dalia Heading 08/14/2011, 1:43 PM

## 2011-08-14 NOTE — Progress Notes (Signed)
Psychoeducational Group Note  Date:  08/14/2011 Time:  1100  Group Topic/Focus:  Relapse Prevention Planning:   The focus of this group is to define relapse and discuss the need for planning to combat relapse.  Participation Level:  Minimal  Participation Quality:  Appropriate  Affect:  Appropriate and Flat  Cognitive:  Appropriate  Insight:  Good  Engagement in Group:  Good  Additional Comments:  Pt attended Relapse Prevention Planning group, pts participation was very limited.   Dalia Heading 08/14/2011, 1:45 PM

## 2011-08-15 LAB — COMPREHENSIVE METABOLIC PANEL
Alkaline Phosphatase: 62 U/L (ref 39–117)
BUN: 19 mg/dL (ref 6–23)
CO2: 27 mEq/L (ref 19–32)
GFR calc Af Amer: >90 mL/min (ref >90)
GFR calc non Af Amer: >90 mL/min (ref >90)
Glucose, Bld: 108 mg/dL — ABNORMAL HIGH (ref 70–99)
Potassium: 3.7 mEq/L (ref 3.5–5.1)
Total Protein: 6.8 g/dL (ref 6.0–8.3)

## 2011-08-15 NOTE — Progress Notes (Signed)
Patient ID: Jay [name], male   DOB: [birth year], 46 y.o.   MRN: [id number] [facility] MD Progress Note  08/15/2011 5:44 PM  S: Patient seen and evaluated with interpreter. Chart reviewed. Patient stated that his mood is gette better and detox going well. Sleep fair.   O:  His affect was mood congruent and euthymic.  He denied any current thoughts of self injurious behavior, suicidal ideation or homicidal ideation. He denied any significant depressive signs or symptoms at this time. There were no auditory or visual hallucinations, paranoia, delusional thought processes, or mania noted. Thought process was linear and goal directed. No psychomotor agitation or retardation was noted. His speech was normal rate, tone and volume. Eye contact was good. Judgment and insight are fair. Patient has been up and engaged on the unit. No acute safety concerns reported from team. Pt agreeable to detox off alcohol. No acute medical or psychiatric issues noted.   Sleep:  Number of Hours: 6.75    Vital Signs:Blood pressure 149/84, pulse 58, temperature 98.2 F (36.8 C), temperature source Oral, resp. rate 18, height 5\' 3"  (1.6 m), weight 59.648 kg (131 lb 8 oz).  Current Medications:     . chlordiazePOXIDE  25 mg Oral TID   Followed by  . chlordiazePOXIDE  25 mg Oral BH-qamhs   Followed by  . chlordiazePOXIDE  25 mg Oral Daily  . feeding supplement  237 mL Oral QPC lunch  . multivitamin with minerals  1 tablet Oral Daily  . thiamine  100 mg Intramuscular Once  . thiamine  100 mg Oral Daily    Lab Results:  Results for orders placed during the hospital encounter of 08/13/11 (from the past 48 hour(s))  COMPREHENSIVE METABOLIC PANEL     Status: Abnormal   Collection Time   08/15/11  6:44 AM      Component Value Range Comment   Sodium 138  135 - 145 mEq/L    Potassium 3.7  3.5 - 5.1 mEq/L    Chloride 101  96 - 112 mEq/L    CO2 27  19 - 32 mEq/L    Glucose, Bld 108 (*) 70 - 99 mg/dL    BUN 19  6 - 23 mg/dL     Creatinine, Ser 4.09  0.50 - 1.35 mg/dL    Calcium 9.3  8.4 - 81.1 mg/dL    Total Protein 6.8  6.0 - 8.3 g/dL    Albumin 3.9  3.5 - 5.2 g/dL    AST 27  0 - 37 U/L    ALT 24  0 - 53 U/L    Alkaline Phosphatase 62  39 - 117 U/L    Total Bilirubin 0.4  0.3 - 1.2 mg/dL    GFR calc non Af Amer >90  >90 mL/min    GFR calc Af Amer >90  >90 mL/min     A/P: Alcohol Use & W/D Disorder; Hypokalemia (resolved)  Continue current meds  Wonda Cerise 08/15/2011, 5:44 PM

## 2011-08-15 NOTE — Progress Notes (Signed)
D: Pt denies SI/HI/AV. Pt is pleasant and cooperative. Pt attends group. Pt has no complaints. Pt has an interpretor who comes to groups with him.   A: Pt was offered support and encouragement. Pt was given scheduled medications. Pt was encourage to attend groups. Q 15 minute checks were done for safety.  R:Pt attends groups and with interpretor is able to interacted with the group. Pt during RN group stated he is feeling better and knows he needs to go to AA groups. Pt is taking medication. Pt receptive to treatment and safety maintained on unit.

## 2011-08-15 NOTE — Progress Notes (Signed)
Patient ID: Jay [name], male   DOB: 1965-04-07, 46 y.o.   MRN: [id number] Pt. attended and participated in aftercare planning group. Pt. verbally accepted information on suicide prevention, warning signs to look for with suicide and crisis line numbers to use. The pt. agreed to call crisis line numbers if having warning signs or having thoughts of suicide. Pt. listed their current anxiety  and depression levels as feeling low.

## 2011-08-15 NOTE — Progress Notes (Signed)
Psychoeducational Group Note  Date:  08/15/2011 Time:  1515  Group Topic/Focus:  Healthy Communication:   The focus of this group is to discuss communication, barriers to communication, as well as healthy ways to communicate with others.  Participation Level:  None  Participation Quality:  Appropriate  Affect:  Appropriate  Cognitive:  Appropriate  Engagement in Group:  None  Additional Comments:  Pt attended group discussing healthy communication. Pt did not participate but he was attentive.    Dalia Heading 08/15/2011, 4:44 PM

## 2011-08-15 NOTE — Progress Notes (Signed)
Patient ID: Skippy Marhefka, male   DOB: 03-14-1965, 46 y.o.   MRN: [id number]  [facility] Group Notes:  (Counselor/Nursing/MHT/Case Management/Adjunct)  08/15/2011 1:15 PM  Type of Therapy:  Group Therapy, Dance/Movement Therapy   Participation Level:  Minimal  Participation Quality:  Appropriate  Affect:  Appropriate  Cognitive:  Appropriate  Insight:  Limited  Engagement in Group:  Limited  Engagement in Therapy:  Limited  Modes of Intervention:  Clarification, Problem-solving, Role-play, Socialization and Support  Summary of Progress/Problems: Therapist discussed the term 'self sabotage', asked group what is their definition of the term.  Group then discussed ways self sabotage occurs in their lives. Therapist passed out handout, The 7 Signs of Self-Sabotaging Behaviors.  Group members read each one aloud   and processed reasons for self sabotaging feels.  Group discussed how to recognize the self sabotaging though and developing positive coping mechanisms.  Pt. stated, "I need to stop drinking alcohol and not be afraid of facing my fears."     Rhunette Croft 08/15/2011. 2:58 PM

## 2011-08-16 DIAGNOSIS — F1020 Alcohol dependence, uncomplicated: Secondary | ICD-10-CM

## 2011-08-16 DIAGNOSIS — F10239 Alcohol dependence with withdrawal, unspecified: Principal | ICD-10-CM

## 2011-08-16 NOTE — Progress Notes (Signed)
Patient ID: Jay [name], male   DOB: October 16, 1965, 46 y.o.   MRN: [id number] [facility] MD Progress Note  08/16/2011 4:12 PM  S: Patient seen and evaluated with interpreter. Chart reviewed. Patient stated that his mood is better and detox going well. Sleep fair. Was educated about his detox today.    O:  His affect was mood congruent and euthymic.  He denied any current thoughts of self injurious behavior, suicidal ideation or homicidal ideation. He denied any significant depressive signs or symptoms at this time. There were no auditory or visual hallucinations, paranoia, delusional thought processes, or mania noted. Thought process was linear and goal directed. No psychomotor agitation or retardation was noted. His speech was normal rate, tone and volume. Eye contact was good. Judgment and insight are fair. Patient has been up and engaged on the unit. No acute safety concerns reported from team. Pt agreeable to detox off alcohol. No acute medical or psychiatric issues noted.   Sleep:  Number of Hours: 5    Vital Signs:Blood pressure 137/92, pulse 60, temperature 96.8 F (36 C), temperature source Oral, resp. rate 24, height 5\' 3"  (1.6 m), weight 59.648 kg (131 lb 8 oz).  Current Medications:     . chlordiazePOXIDE  25 mg Oral BH-qamhs   Followed by  . chlordiazePOXIDE  25 mg Oral Daily  . feeding supplement  237 mL Oral QPC lunch  . multivitamin with minerals  1 tablet Oral Daily  . thiamine  100 mg Intramuscular Once  . thiamine  100 mg Oral Daily    Lab Results:  Results for orders placed during the hospital encounter of 08/13/11 (from the past 48 hour(s))  COMPREHENSIVE METABOLIC PANEL     Status: Abnormal   Collection Time   08/15/11  6:44 AM      Component Value Range Comment   Sodium 138  135 - 145 mEq/L    Potassium 3.7  3.5 - 5.1 mEq/L    Chloride 101  96 - 112 mEq/L    CO2 27  19 - 32 mEq/L    Glucose, Bld 108 (*) 70 - 99 mg/dL    BUN 19  6 - 23 mg/dL    Creatinine, Ser 6.57   0.50 - 1.35 mg/dL    Calcium 9.3  8.4 - 84.6 mg/dL    Total Protein 6.8  6.0 - 8.3 g/dL    Albumin 3.9  3.5 - 5.2 g/dL    AST 27  0 - 37 U/L    ALT 24  0 - 53 U/L    Alkaline Phosphatase 62  39 - 117 U/L    Total Bilirubin 0.4  0.3 - 1.2 mg/dL    GFR calc non Af Amer >90  >90 mL/min    GFR calc Af Amer >90  >90 mL/min     A/P: Alcohol Use & W/D Disorder; Hypokalemia (resolved)  Continue current meds  Wonda Cerise 08/16/2011, 4:12 PM

## 2011-08-16 NOTE — Progress Notes (Signed)
Patient ID: Jay [name], male   DOB: October 03, 1965, 46 y.o.   MRN: [id number]  Problem: Alcohol Dependence  D: Pt out in milieu but with minimal interaction. Pt endorses depression with dull, flat affect.  A: Monitor Q 15 minutes for safety, encourage staff/peer interaction and group participation. Administer medications as ordered by MD.  R: Pt participated in group session. Pt declined Librium stating med made him feel irritable. Pt withdrawn but denies SI or plans to harm himself.

## 2011-08-16 NOTE — Progress Notes (Signed)
Patient ID: Jay [name], male   DOB: [birth year], 46 y.o.   MRN: [id number] Pt. attended and participated in aftercare planning group. Pt. accepted information on suicide prevention, warning signs to look for with suicide and crisis line numbers to use. The pt. agreed to call crisis line numbers if having warning signs or having thoughts of suicide. Pt. listed their current anxiety level as 7 and depression as a 6 .  Pt. indicated that he medication is making him feel anxious.

## 2011-08-16 NOTE — Progress Notes (Signed)
Psychoeducational Group Note  Date:  08/16/2011 Time:  1000am  Group Topic/Focus:  Making Healthy Choices:   The focus of this group is to help patients identify negative/unhealthy choices they were using prior to admission and identify positive/healthier coping strategies to replace them upon discharge.  Participation Level:  Active  Participation Quality:  Appropriate  Affect:  Appropriate  Cognitive:  Appropriate  Insight:  Good  Engagement in Group:  Good  Additional Comments:    Jay [name] 08/16/2011,11:30 AM

## 2011-08-16 NOTE — Progress Notes (Signed)
Psychoeducational Group Note  Date:  08/16/2011 Time:  0930  Group Topic/Focus:  Spirituality:   The focus of this group is to discuss how one's spirituality can aide in recovery.  Participation Level:  Minimal  Participation Quality:  Appropriate  Affect:  Appropriate and Depressed  Cognitive:  Alert and Appropriate  Insight:  Pt did not talk during group  Engagement in Group:  None  Additional Comments:  Pt attended non-denominational spirituality group where pts discussed article "Dealing with Disappointment". Pt was attentive when listening to interrupter and wanted to stay in group to listen to discussion. Pt did not participate in discussion.    Dalia Heading 08/16/2011, 10:08 AM

## 2011-08-16 NOTE — Progress Notes (Signed)
D: Pt denies SI/HI/AV. Pt is pleasant and cooperative. Pt rates depression at a 5 and Helplessness/hopelessness at a 0.  A: Pt was offered support and encouragement. Pt was given scheduled medications. Pt was encourage to attend groups. Q 15 minute checks were done for safety.  R:Pt attends groups and interacts well with peers and staff. Pt is really able to participate and his interpretor is he feels is really good for him. Pt  taking medication. Pt has no complaints.Pt receptive to treatment and safety maintained on unit.

## 2011-08-16 NOTE — Progress Notes (Signed)
Pt attended the speaker AA meeting with an interpreter and was attentive.  Pt and interpreter approached this writer with pt's concerns that his detox medicine was making him "feel aggressive".  This Clinical research associate told pt that he could refuse his medicine tonight and speak with the doctor tomorrow about his concerns.

## 2011-08-16 NOTE — Progress Notes (Signed)
Psychoeducational Group Note  Date:  08/16/2011 Time:  0315  Group Topic/Focus:  Making Healthy Choices:   The focus of this group is to help patients identify negative/unhealthy choices they were using prior to admission and identify positive/healthier coping strategies to replace them upon discharge.  Participation Level:  Active  Participation Quality:  Appropriate, Attentive and Supportive  Affect:  Appropriate and Flat  Cognitive:  Alert and Appropriate  Insight:  Good  Engagement in Group:  Good  Additional Comments:  Pt attended group on "Detoxing you Thoughts". Pt identified his negative trigger as going out to a club/bar and then identified associating thoughts, feelings and actions to go with that trigger.   Dalia Heading 08/16/2011, 4:35 PM

## 2011-08-17 NOTE — [facility] Suicide Risk Assessment (Signed)
Suicide Risk Assessment  Discharge Assessment      Demographic factors:  Male;Low socioeconomic status;Living alone;Unemployed  Current Mental Status Per Nursing Assessment:   At Discharge: Pt denied any SI/HI/thoughts of self harm or acute psychiatric issues in treatment team with clinical, nursing and medical team present.  PTA Medications:  No prescriptions prior to admission    Previous Psychotropic Medications: none.   Substance Abuse History in the last 12 months: daily use of alcohol. Denied any other prescription or illicit drug use.   Consequences of Substance Abuse: lost all of his papers/wallet and has no ID.   Social History: Unemployed; unmarried; no children; cousin in [place]   Family History: No family history on file.   Mental Status Examination: Patient seen and evaluated. Chart reviewed. Patient stated that his mood was "good". His affect was mood congruent and euthymic. He denied any current thoughts of self injurious behavior, suicidal ideation or homicidal ideation. He denied any significant depressive signs or symptoms at this time. There were no auditory or visual hallucinations, paranoia, delusional thought processes, or mania noted. Thought process was linear and goal directed. No psychomotor agitation or retardation was noted. His speech was normal rate, tone and volume. Eye contact was good. Judgment and insight are fair. Patient has been up and engaged on the unit. No acute safety concerns reported from team. Pt agreeable to detox off alcohol. No acute medical or psychiatric issues noted.   Loss Factors: Financial problems / change in socioeconomic status  Historical Factors: Family history of mental illness or substance abuse  Risk Reduction Factors: going tos tay with cousin; AA.  Discharge Diagnoses: Alcohol Use & W/D Disorder; Hypokalemia, resolved    Cognitive Features That Contribute To Risk: limited insight.  Suicide Risk: Patient is currently  viewed as a low risk of harm to himself and others in light of his history and risk factors. There are no acute safety concerns and he is stable for discharge. His continued sobriety will mitigate against any potential increased risk in the future. He is agreeable with the plan to f/u with AA and access ED services if necessary.  Discussed with the team.  Plan Of Care/Follow-up recommendations: Pt seen and evaluated in treatment team. Chart reviewed.  Pt stable for and requesting discharge to home with cousin via bus. Pt contracting for safety and does not currently meet [place] involuntary commitment criteria for continued hospitalization against his will.  Discussed the importance of recovery further with pt, as well as, tools to move forward in a healthy & safe manner.  Pt agreeable with the plan.  Discussed with the team.  Please see orders and full discharge summary to be completed by physician extender.  Recommend follow up with AA.  Diet: Regular.  Activity: As tolerated.  Pt will call tomorrow with cousin's address so that we can set him up with outpt f/u in [place].     Lupe Carney 08/17/2011, 11:16 AM

## 2011-08-17 NOTE — Progress Notes (Signed)
Currently resting quietly in bed with eyes closed. Respirations are even and unlabored. No acute distress or discomfort noted. Safety has been maintained with Q15 minute observation. Will continue current POC. 

## 2011-08-17 NOTE — Discharge Summary (Signed)
Physician Discharge Summary Note  Patient:  Jay [name] is an 46 y.o., male MRN:  [id number] DOB:  1966/01/04 Patient phone:  [phone] (home)  Patient address:   [place] [address],   Date of Admission:  08/13/2011 Date of Discharge: 08/16/11  Reason for Admission: Alcohol intoxication  Discharge Diagnoses: Principal Problem:  *Alcohol dependence   Axis Diagnosis:   AXIS I:  Alcohol dependence AXIS II:  Deferred AXIS III:  No past medical history on file. AXIS IV:  economic problems, educational problems, housing problems, occupational problems and other psychosocial or environmental problems AXIS V:  62  Level of Care:  OP  Hospital Course:  Pt is a 46 yo unmarried Hispanic male who presented through the [facility] ED for detox off alcohol. He is currently homeless, unemployed and lost all of his "papers". Pt has been drinking until complete intoxication daily. He denied any hx of w/d seizures, complications or DTs. Denied all other prescription and illicit drug use. Further denied any acute safety concerns.    Upon admission in this hospital, Jay [name] was started on Librium protocol for his alcohol detoxification.  He was also enrolled in group counseling sessions and activities to learn coping skills that will help him maintain a much longer sobriety if practiced as recommended. He also attended AA/NA meetings being offered and held in this unit. And because Jay [name] speaks little to no english, his interaction with staff is through a Hispanic interpreter from the language resource.  He has no previous and or identifiable medical conditions that required treatment and or monitoring while going through detoxification treatment. He was monitored closely for any potential problems that may arise as a result of and or during detoxification treatment. Patient tolerated his treatment regimen and detoxification treatment without any significant adverse effects and or  reactions.  Patient attended treatment team meeting this am and met with the team. His symptoms, substance abuse issues, response to treatment and discharge plans discussed. Patient endorsed that he is doing well and stable for discharge to pursue the next phase of his substance abuse treatment. And because patient presented to the hospital as an undocumented immigrant with no forms of identification, this posed a huge obstacle in setting him up for a post-discharge follow-up appointments and also an outpatient treatment place.   In any case, he was encouraged to attend 90 AA meetings in 90 days, get a trusted sponsor from the advise of others or from whomever within the AA meetings seems to make sense, and has a proven track record, and will hold him responsible for his sobriety, and both expects and insists on his total abstinence from alcohol. Through his Hispanic, Jay [name] stated that he will call his case worker with an address (his cousin's) upon getting to [facility] so that a follow-up appointment for follow-up care can be made or arranged for him.  It was agreed upon between patient and the team that he will be discharged to his cousin's home in [place], [place].  Upon discharge, patient adamantly denies suicidal, homicidal ideations, auditory, visual hallucinations, delusional thinking and or withdrawal symptoms. Patient left [facility] with all personal belongings in no apparent distress.  A bus pass to [place] was also provided for patient for his trip to his cousin's house in [place]. No medications were provided because he is not any continued medications.   Consults:  None  Significant Diagnostic Studies:  labs: CBC with diff, CMP, Toxicology  Discharge Vitals:   Blood pressure 127/91,  pulse 63, temperature 97.3 F (36.3 C), temperature source Oral, resp. rate 20, height 5\' 3"  (1.6 m), weight 59.648 kg (131 lb 8 oz).  Mental Status Exam: See Mental Status Examination and Suicide Risk Assessment  completed by Attending Physician prior to discharge.  Discharge destination:  Other:  Home with cousin in [place], [place]  Is patient on multiple antipsychotic therapies at discharge:  No   Has Patient had three or more failed trials of antipsychotic monotherapy by history:  No  Recommended Plan for Multiple Antipsychotic Therapies: NA   Medication List    Notice       You have not been prescribed any medications.            Follow-up Information    Follow up with Attend AA mtgs.         Follow-up recommendations: Activity:  as tolerated Other:  Keep all scheduled follow-up appointments as recommended.    Comments: Take all your medications as prescribed by your mental healthcare provider. Report any adverse effects and or reactions from your medicines to your outpatient provider promptly. Patient is instructed and cautioned to not engage in alcohol and or illegal drug use while on prescription medicines. In the event of worsening symptoms, patient is instructed to call the crisis hotline, 911 and or go to the nearest ED for appropriate evaluation and treatment of symptoms. Follow-up with your primary care provider for your other medical issues, concerns and or health care needs.     SignedArmandina Stammer I 08/17/2011, 3:59 PM

## 2011-08-17 NOTE — Progress Notes (Signed)
[facility] Case Management Discharge Plan:  Will you be returning to the same living situation after discharge: No. At discharge, do you have transportation home?:Yes,  bus Do you have the ability to pay for your medications:Yes,  no meds  Interagency Information:     Release of information consent forms completed and in the chart;  Patient's signature needed at discharge.  Patient to Follow up at:  Follow-up Information    Follow up with Attend AA mtgs.         Patient denies SI/HI:   Yes,  yes    Safety Planning and Suicide Prevention discussed:  Yes,  yes  Barrier to discharge identified:No.  Summary and Recommendations:   Jay [name] 08/17/2011, 10:13 AM

## 2011-08-17 NOTE — Progress Notes (Signed)
Discharge Note:  Patient discharged by bus, plans to go live with relatives in [place], [place].  Denied access to firearms.  Will followup with psychiatrist in [place], [place] area.  Denied SI and HI.   Denied A/V hallucinations.  Denied pain.  Patient received his black tennis shoes in [place] [address].   Patient stated he appreciated all the staff has done to assist him while at [facility].  Thanks to all the employees that helped him while at [facility].

## 2011-08-17 NOTE — Progress Notes (Signed)
Psychoeducational Group Note  Date:  08/17/2011 Time:  1000  Group Topic/Focus:  Goals Group:   The focus of this group is to help patients establish daily goals to achieve during treatment and discuss how the patient can incorporate goal setting into their daily lives to aide in recovery.  Participation Level:  Minimal  Participation Quality:  Appropriate  Affect:  Appropriate  Cognitive:  Appropriate  Insight:  Good  Engagement in Group:  Good  Additional Comments: pt participated and processed in group.  Marquis Lunch, Chrystel Barefield 08/17/2011, 11:00 AM

## 2011-08-17 NOTE — Progress Notes (Signed)
Patient attended this evenings speaker AA meeting.   

## 2011-08-19 NOTE — Progress Notes (Signed)
Patient Discharge Instructions: No scheduled follow up.  Wandra Scot, 08/19/2011, 3:54 PM

## 2013-02-20 ENCOUNTER — Emergency Department (HOSPITAL_COMMUNITY)
Admission: EM | Admit: 2013-02-20 | Discharge: 2013-02-21 | Disposition: A | Discharge status: Home / Self Care | Payer: Self-pay | Attending: Emergency Medicine | Admitting: Emergency Medicine

## 2013-02-20 ENCOUNTER — Encounter (HOSPITAL_COMMUNITY): Payer: Self-pay | Admitting: Emergency Medicine

## 2013-02-20 DIAGNOSIS — F10929 Alcohol use, unspecified with intoxication, unspecified: Secondary | ICD-10-CM

## 2013-02-20 DIAGNOSIS — R110 Nausea: Secondary | ICD-10-CM | POA: Insufficient documentation

## 2013-02-20 DIAGNOSIS — F1010 Alcohol abuse, uncomplicated: Secondary | ICD-10-CM | POA: Insufficient documentation

## 2013-02-20 LAB — CBC WITH DIFFERENTIAL/PLATELET
Basophils Absolute: 0 10*3/uL (ref 0.0–0.1)
Basophils Relative: 1 % (ref 0–1)
EOS ABS: 0.1 10*3/uL (ref 0.0–0.7)
EOS PCT: 2 % (ref 0–5)
HCT: 37.8 % — ABNORMAL LOW (ref 39.0–52.0)
HEMOGLOBIN: 13.8 g/dL (ref 13.0–17.0)
LYMPHS ABS: 1.8 10*3/uL (ref 0.7–4.0)
Lymphocytes Relative: 45 % (ref 12–46)
MCH: 33.2 pg (ref 26.0–34.0)
MCHC: 36.5 g/dL — ABNORMAL HIGH (ref 30.0–36.0)
MCV: 90.9 fL (ref 78.0–100.0)
MONOS PCT: 5 % (ref 3–12)
Monocytes Absolute: 0.2 10*3/uL (ref 0.1–1.0)
Neutro Abs: 1.9 10*3/uL (ref 1.7–7.7)
Neutrophils Relative %: 47 % (ref 43–77)
Platelets: 210 10*3/uL (ref 150–400)
RBC: 4.16 MIL/uL — AB (ref 4.22–5.81)
RDW: 12.2 % (ref 11.5–15.5)
WBC: 4.1 10*3/uL (ref 4.0–10.5)

## 2013-02-20 LAB — COMPREHENSIVE METABOLIC PANEL
ALT: 22 U/L (ref 0–53)
AST: 35 U/L (ref 0–37)
Albumin: 4.2 g/dL (ref 3.5–5.2)
Alkaline Phosphatase: 71 U/L (ref 39–117)
BUN: 6 mg/dL (ref 6–23)
CALCIUM: 8.4 mg/dL (ref 8.4–10.5)
CO2: 25 mEq/L (ref 19–32)
CREATININE: 0.83 mg/dL (ref 0.50–1.35)
Chloride: 102 mEq/L (ref 96–112)
GFR calc non Af Amer: >90 mL/min (ref >90)
GLUCOSE: 103 mg/dL — AB (ref 70–99)
Potassium: 3.7 mEq/L (ref 3.7–5.3)
Sodium: 144 mEq/L (ref 137–147)
TOTAL PROTEIN: 7.3 g/dL (ref 6.0–8.3)
Total Bilirubin: 0.3 mg/dL (ref 0.3–1.2)

## 2013-02-20 LAB — ETHANOL: ALCOHOL ETHYL (B): 333 mg/dL — AB (ref 0–11)

## 2013-02-20 MED ORDER — ONDANSETRON 8 MG PO TBDP
8.0000 mg | ORAL_TABLET | Freq: Once | ORAL | Status: AC
  Administered 2013-02-20: 8 mg via ORAL
  Filled 2013-02-20: qty 1

## 2013-02-20 NOTE — ED Notes (Signed)
[place]  Found patient sitting on ground eating. Patient is alert with confusion due to ETOH on board.

## 2013-02-20 NOTE — ED Provider Notes (Signed)
CSN: [id number]     Arrival date & time 02/20/13  1927 History   First MD Initiated Contact with Patient 02/20/13 1945     Chief Complaint  Patient presents with  . Alcohol Intoxication   (Consider location/radiation/quality/duration/timing/severity/associated sxs/prior Treatment) HPI Jay [name] is a 48 y.o. male, hispanic speaking, presents to ED after EMS was called to a gas station where he was found on the ground, eating, confused, intoxicated. Pt denies any falls. States he drank a large amount of alcohol. States drinks daily. Denies any drugs. Admits to nausea. Denies vomiting. Denies abdominal pain. No other complaints.      History reviewed. No pertinent past medical history. History reviewed. No pertinent past surgical history. History reviewed. No pertinent family history. History  Substance Use Topics  . Smoking status: Never Smoker   . Smokeless tobacco: Not on file  . Alcohol Use: Yes     Comment: six 24 oz cans of beer daily    Review of Systems  Constitutional: Negative for fever and chills.  Respiratory: Negative for cough, chest tightness and shortness of breath.   Cardiovascular: Negative for chest pain, palpitations and leg swelling.  Gastrointestinal: Negative for nausea, vomiting, abdominal pain, diarrhea and abdominal distention.  Genitourinary: Negative for dysuria, urgency, frequency and hematuria.  Musculoskeletal: Negative for arthralgias, myalgias, neck pain and neck stiffness.  Skin: Negative for rash.  Allergic/Immunologic: Negative for immunocompromised state.  Neurological: Negative for dizziness, weakness, light-headedness, numbness and headaches.    Allergies  Review of patient's allergies indicates no known allergies.  Home Medications  No current outpatient prescriptions on file. BP 143/99  Pulse 80  Temp(Src) 97.4 F (36.3 C) (Oral)  Resp 18  SpO2 98% Physical Exam  Nursing note and vitals reviewed. Constitutional: He is oriented  to person, place, and time. He appears well-developed and well-nourished.  intoxicated  HENT:  Head: Normocephalic and atraumatic.  Eyes: Conjunctivae are normal.  Neck: Neck supple.  Cardiovascular: Normal rate, regular rhythm and normal heart sounds.   Pulmonary/Chest: Effort normal. No respiratory distress. He has no wheezes. He has no rales.  Abdominal: Soft. Bowel sounds are normal. He exhibits no distension. There is no tenderness. There is no rebound.  Musculoskeletal: He exhibits no edema.  Neurological: He is alert and oriented to person, place, and time.  Skin: Skin is warm and dry.    ED Course  Procedures (including critical care time) Labs Review Labs Reviewed  CBC WITH DIFFERENTIAL - Abnormal; Notable for the following:    RBC 4.16 (*)    HCT 37.8 (*)    MCHC 36.5 (*)    All other components within normal limits  COMPREHENSIVE METABOLIC PANEL - Abnormal; Notable for the following:    Glucose, Bld 103 (*)    All other components within normal limits  ETHANOL - Abnormal; Notable for the following:    Alcohol, Ethyl (B) 333 (*)    All other components within normal limits   Imaging Review No results found.  EKG Interpretation   None       MDM   1. Alcohol intoxication     Pt appears intoxicated but is able to answer questions appropriately. Will get basic labs, alcohol level. Will monitor. zofran ordered for nausea.   12:05 AM Labs are as above. Alcohol 333. VS normal. Will continue to monitor until sober.   Signed out to NP Tomasa BlaseSchultz and Dr. Read DriversMolpus at shift change     Lottie Musselatyana A Jocsan Mcginley, PA-C 02/21/13 2200

## 2013-02-20 NOTE — ED Notes (Signed)
Patient refused blood draw.

## 2013-02-20 NOTE — ED Notes (Signed)
Patient refused blood draw for labs.RN Topher made aware

## 2013-02-21 NOTE — ED Provider Notes (Signed)
Patient awakens.  He is ambulatory  he's been discharged home with instructions for alcohol intoxication and binding.  Treatment if he so desires  Arman FilterGail K Ethne Jeon, NP 02/21/13 0502

## 2013-02-21 NOTE — Discharge Instructions (Signed)
Finding Treatment for Alcohol and Drug Addiction  It can be hard to find the right place to get professional treatment. Here are some important things to consider:   There are different types of treatment to choose from.   Some programs are live-in (residential) while others are not (outpatient). Sometimes a combination is offered.   No single type of program is right for everyone.   Most treatment programs involve a combination of education, counseling, and a 12-step, spiritually-based approach.   There are non-spiritually based programs (not 12-step).   Some treatment programs are government sponsored. They are geared for patients without private insurance.   Treatment programs can vary in many respects such as:   Cost and types of insurance accepted.   Types of on-site medical services offered.   Length of stay, setting, and size.   Overall philosophy of treatment.  A person may need specialized treatment or have needs not addressed by all programs. For example, adolescents need treatment appropriate for their age. Other people have secondary disorders that must be managed as well. Secondary conditions can include mental illness, such as depression or diabetes. Often, a period of detoxification from alcohol or drugs is needed. This requires medical supervision and not all programs offer this.  THINGS TO CONSIDER WHEN SELECTING A TREATMENT PROGRAM    Is the program certified by the appropriate government agency? Even private programs must be certified and employ certified professionals.   Does the program accept your insurance? If not, can a payment plan be set up?   Is the facility clean, organized, and well run? Do they allow you to speak with graduates who can share their treatment experience with you? Can you tour the facility? Can you meet with staff?   Does the program meet the full range of individual needs?   Does the treatment program address sexual orientation and physical disabilities?  Do they provide age, gender, and culturally appropriate treatment services?   Is treatment available in languages other than English?   Is long-term aftercare support or guidance encouraged and provided?   Is assessment of an individual's treatment plan ongoing to ensure it meets changing needs?   Does the program use strategies to encourage reluctant patients to remain in treatment long enough to increase the likelihood of success?   Does the program offer counseling (individual or group) and other behavioral therapies?   Does the program offer medicine as part of the treatment regimen, if needed?   Is there ongoing monitoring of possible relapse? Is there a defined relapse prevention program? Are services or referrals offered to family members to ensure they understand addiction and the recovery process? This would help them support the recovering individual.   Are 12-step meetings held at the center or is transport available for patients to attend outside meetings?  In countries outside of the [place], see local directories for contact information for services in your area.  Document Released: 12/04/2004 Document Revised: 03/30/2011 Document Reviewed: 06/16/2007  ExitCare Patient Information 2014 [employer].

## 2013-02-21 NOTE — ED Notes (Signed)
Patient is alert and oriented x3.  He was given DC instructions and follow up visit instructions.  Patient gave verbal understanding.  He was DC ambulatory under his own power to home.  V/S stable.  He was not showing any signs of distress on DC 

## 2013-02-22 NOTE — ED Provider Notes (Signed)
Medical screening examination/treatment/procedure(s) were performed by non-physician practitioner and as supervising physician I was immediately available for consultation/collaboration.  EKG Interpretation   None         Hanley SeamenJohn L Jushua Waltman, MD 02/22/13 [phone]

## 2013-02-24 NOTE — ED Provider Notes (Signed)
Medical screening examination/treatment/procedure(s) were performed by non-physician practitioner and as supervising physician I was immediately available for consultation/collaboration.  Samiya Mervin M Christia Domke, MD 02/24/13 1955 

## 2013-03-03 ENCOUNTER — Encounter (HOSPITAL_COMMUNITY): Payer: Self-pay | Admitting: Emergency Medicine

## 2013-03-03 ENCOUNTER — Emergency Department (HOSPITAL_COMMUNITY)
Admission: EM | Admit: 2013-03-03 | Discharge: 2013-03-03 | Disposition: A | Discharge status: Home / Self Care | Payer: Self-pay | Attending: Emergency Medicine | Admitting: Emergency Medicine

## 2013-03-03 DIAGNOSIS — F10929 Alcohol use, unspecified with intoxication, unspecified: Secondary | ICD-10-CM

## 2013-03-03 DIAGNOSIS — F1010 Alcohol abuse, uncomplicated: Secondary | ICD-10-CM | POA: Insufficient documentation

## 2013-03-03 HISTORY — DX: Alcohol abuse, uncomplicated: F10.10

## 2013-03-03 LAB — CBC WITH DIFFERENTIAL/PLATELET
Basophils Absolute: 0 10*3/uL (ref 0.0–0.1)
Basophils Relative: 0 % (ref 0–1)
EOS ABS: 0.1 10*3/uL (ref 0.0–0.7)
Eosinophils Relative: 2 % (ref 0–5)
HCT: 37.4 % — ABNORMAL LOW (ref 39.0–52.0)
Hemoglobin: 13.8 g/dL (ref 13.0–17.0)
LYMPHS ABS: 1.2 10*3/uL (ref 0.7–4.0)
LYMPHS PCT: 39 % (ref 12–46)
MCH: 90.6 pg — AB (ref 26.0–34.0)
MCHC: 36.9 g/dL — ABNORMAL HIGH (ref 30.0–36.0)
MCV: 90.6 fL (ref 78.0–100.0)
Monocytes Absolute: 0.3 10*3/uL (ref 0.1–1.0)
Monocytes Relative: 10 % (ref 3–12)
NEUTROS PCT: 49 % (ref 43–77)
Neutro Abs: 1.5 10*3/uL — ABNORMAL LOW (ref 1.7–7.7)
PLATELETS: 186 10*3/uL (ref 150–400)
RBC: 4.13 MIL/uL — AB (ref 4.22–5.81)
RDW: 13.1 % (ref 11.5–15.5)
WBC: 3.2 10*3/uL — AB (ref 4.0–10.5)

## 2013-03-03 LAB — BASIC METABOLIC PANEL
BUN: 10 mg/dL (ref 6–23)
CO2: 25 mEq/L (ref 19–32)
Calcium: 8 mg/dL — ABNORMAL LOW (ref 8.4–10.5)
Chloride: 101 mEq/L (ref 96–112)
Creatinine, Ser: 0.89 mg/dL (ref 0.50–1.35)
GFR calc Af Amer: >90 mL/min (ref >90)
GLUCOSE: 111 mg/dL — AB (ref 70–99)
POTASSIUM: 3.4 meq/L — AB (ref 3.7–5.3)
SODIUM: 144 meq/L (ref 137–147)

## 2013-03-03 LAB — ETHANOL: ALCOHOL ETHYL (B): 290 mg/dL — AB (ref 0–11)

## 2013-03-03 NOTE — ED Provider Notes (Signed)
CSN: [id number]     Arrival date & time 03/03/13  0907 History   First MD Initiated Contact with Patient 03/03/13 1018     Chief Complaint  Patient presents with  . Generalized Body Aches  . Cold Exposure      The history is provided by the EMS personnel and the patient. The history is limited by the condition of the patient (intoxicated).  Pt was seen at 1030. Per EMS and pt report: pt states he slept outside last night and "feels cold." States he "drank some beers to get warm."  The symptoms have been associated with no other complaints. The patient has a significant history of similar symptoms previously, recently being evaluated for this complaint and multiple prior evals for same.     Past Medical History  Diagnosis Date  . Alcohol abuse    History reviewed. No pertinent past surgical history.  History  Substance Use Topics  . Smoking status: Never Smoker   . Smokeless tobacco: Not on file  . Alcohol Use: Yes     Comment: six 24 oz cans of beer daily    Review of Systems  Unable to perform ROS: Other      Allergies  Review of patient's allergies indicates no known allergies.  Home Medications  No current outpatient prescriptions on file. BP 128/92  Pulse 116  Temp(Src) 97.9 F (36.6 C) (Oral)  Resp 18  SpO2 96% Physical Exam 1035: Physical examination:  Nursing notes reviewed; Vital signs and O2 SAT reviewed;  Constitutional: Well developed, Well nourished, Well hydrated, In no acute distress; Head:  Normocephalic, atraumatic; Eyes: EOMI, PERRL, No scleral icterus; ENMT: Mouth and pharynx normal, Mucous membranes moist; Neck: Supple, Full range of motion, No lymphadenopathy; Cardiovascular: Regular rate and rhythm, No gallop; Respiratory: Breath sounds clear & equal bilaterally, No wheezes.  Speaking full sentences with ease, Normal respiratory effort/excursion; Chest: Nontender, Movement normal; Abdomen: Soft, Nontender, Nondistended, Normal bowel sounds;  Genitourinary: No CVA tenderness; Extremities: Pulses normal, No tenderness, No edema, No calf edema or asymmetry.; Neuro: AA&Ox3, Major CN grossly intact.  Speech clear. No gross focal motor or sensory deficits in extremities.; Skin: Color normal, Warm, Dry.   ED Course  Procedures     EKG Interpretation   None       MDM  MDM Reviewed: previous chart, nursing note and vitals Reviewed previous: labs Interpretation: labs    Results for orders placed during the hospital encounter of 03/03/13  ETHANOL      Result Value Ref Range   Alcohol, Ethyl (B) 290 (*) 0 - 11 mg/dL  CBC WITH DIFFERENTIAL      Result Value Ref Range   WBC 3.2 (*) 4.0 - 10.5 K/uL   RBC 4.13 (*) 4.22 - 5.81 MIL/uL   Hemoglobin 13.8  13.0 - 17.0 g/dL   HCT 72.5 (*) 36.6 - 44.0 %   MCV 90.6  78.0 - 100.0 fL   MCH 90.6 (*) 26.0 - 34.0 pg   MCHC 36.9 (*) 30.0 - 36.0 g/dL   RDW 34.7  42.5 - 95.6 %   Platelets 186  150 - 400 K/uL   Neutrophils Relative % 49  43 - 77 %   Neutro Abs 1.5 (*) 1.7 - 7.7 K/uL   Lymphocytes Relative 39  12 - 46 %   Lymphs Abs 1.2  0.7 - 4.0 K/uL   Monocytes Relative 10  3 - 12 %   Monocytes Absolute 0.3  0.1 -  1.0 K/uL   Eosinophils Relative 2  0 - 5 %   Eosinophils Absolute 0.1  0.0 - 0.7 K/uL   Basophils Relative 0  0 - 1 %   Basophils Absolute 0.0  0.0 - 0.1 K/uL  BASIC METABOLIC PANEL      Result Value Ref Range   Sodium 144  137 - 147 mEq/L   Potassium 3.4 (*) 3.7 - 5.3 mEq/L   Chloride 101  96 - 112 mEq/L   CO2 25  19 - 32 mEq/L   Glucose, Bld 111 (*) 70 - 99 mg/dL   BUN 10  6 - 23 mg/dL   Creatinine, Ser 1.610.89  0.50 - 1.35 mg/dL   Calcium 8.0 (*) 8.4 - 10.5 mg/dL   GFR calc non Af Amer >90  >90 mL/min   GFR calc Af Amer >90  >90 mL/min     1515:  Pt has slept most of his ED visit. Pt is now awake/alert, ambulating the hallways and wanting to go home now. Gait steady, resps easy, NAD, VS remain stable. Dx and testing d/w pt.  Questions answered.  Verb  understanding, agreeable to d/c home with outpt f/u.      Laray AngerKathleen M Dejion Grillo, DO 03/04/13 1343

## 2013-03-03 NOTE — ED Notes (Signed)
Patient can not get an urine at this time will try later 

## 2013-03-03 NOTE — Discharge Instructions (Signed)
°Emergency Department Resource Guide °1) Find a Doctor and Pay Out of Pocket °Although you won't have to find out who is covered by your insurance plan, it is a good idea to ask around and get recommendations. You will then need to call the office and see if the doctor you have chosen will accept you as a new patient and what types of options they offer for patients who are self-pay. Some doctors offer discounts or will set up payment plans for their patients who do not have insurance, but you will need to ask so you aren't surprised when you get to your appointment. ° °2) Contact Your Local Health Department °Not all health departments have doctors that can see patients for sick visits, but many do, so it is worth a call to see if yours does. If you don't know where your local health department is, you can check in your phone book. The CDC also has a tool to help you locate your state's health department, and many state websites also have listings of all of their local health departments. ° °3) Find a Walk-in Clinic °If your illness is not likely to be very severe or complicated, you may want to try a walk in clinic. These are popping up all over the country in pharmacies, drugstores, and shopping centers. They're usually staffed by nurse practitioners or physician assistants that have been trained to treat common illnesses and complaints. They're usually fairly quick and inexpensive. However, if you have serious medical issues or chronic medical problems, these are probably not your best option. ° °No Primary Care Doctor: °- Call Health Connect at  832-8000 - they can help you locate a primary care doctor that  accepts your insurance, provides certain services, etc. °- Physician Referral Service- 1-800-533-3463 ° °Chronic Pain Problems: °Organization         Address  Phone   Notes  °[facility]  ([phone]) [phone] Patients need to be referred by their primary care doctor.  ° °Medication  Assistance: °Organization         Address  Phone   Notes  °[place] [facility] [address] °[place], [place] [address] (336) 641-8030 --Must be a resident of [place] °-- Must have NO insurance coverage whatsoever (no Medicaid/ Medicare, etc.) °-- The pt. MUST have a primary care doctor that directs their care regularly and follows them in the community °  °[facility]  (866) 331-1348   °[employer]  (888) 892-1162   ° °Agencies that provide inexpensive medical care: °Organization         Address  Phone   Notes  °[facility]  (336) 832-8035   °[facility]    (336) 832-7272   °[facility] [address] °[place], [place] [address] (336) 832-4777   °[facility] [address], °[place] (336) 271-4999   °[facility]    (336) 373-0678   °[facility]    (336) 272-1050   °[facility] ° [address], [place] Phone:  (336) 832-4444, Fax:  (336) 832-4440 Hours of Operation:  9 am - 6 pm, M-F.  Also accepts Medicaid/Medicare and self-pay.  °[facility] ° [address], [place] Phone: (336) [phone], Fax: (336) [phone]. Hours of Operation:  8:30 am - 5:30 pm, M-F.  Also accepts Medicaid and self-pay.  °[facility] [place] [address], [place] Phone: (336) 878-6027   °[facility] [address], [place], [place] (336)723-1848, Ext. 123 Mondays & Thursdays: 7-9 AM.  First 15 patients are seen on a first come, first serve basis. °  ° °Medicaid-accepting [place] Providers: ° °Organization         Address  Phone   Notes  °[facility] [address], [place] (336) 641-2100 Also accepts self-pay patients.  °[facility] [address], [place] ° (336) 856-9996   °[facility] [address], [place]  (336) 288-8857   °[facility] [address] [place] [address], [place] (336) 299-7000   °Veita Bland [address], [place]  ° (336) 373-1557 Only accepts [place] Access Medicaid patients after they have their name applied to their card.  ° °Self-Pay (no insurance) in [place]: ° °Organization         Address  Phone   Notes  °Sickle Cell Patients, [facility] [address], [place] (336) 832-1970   °[facility] [address], [place] (336) 832-4400   °[facility] ° [address], [place] (336) 992-4800   °[facility]/Dr. Osei-Bonsu ° [address] [place] [address], [place] or [address], [place] (336) [phone] Phone number for both [place] and [place] locations is the same.  °[facility] [address], [place] (336) 299-0000   °[facility] [address] [place] [address], [place] or [address] (336) 852-7530 °(336) 878-2260   °[facility] [address], [place] (336) 350-1642, phone; (336) 294-5005, fax Sees patients 1st and 3rd Saturday of every month.  Must not qualify for public or private insurance (i.e. Medicaid, Medicare, [place] Health Choice, Veterans' Benefits) • Household income should be no more than 200% of the poverty level •The clinic cannot treat you if you are pregnant or think you are pregnant • Sexually transmitted diseases are not treated at the clinic.  ° ° °Dental Care: °Organization         Address  Phone  Notes  °[place] [facility] [address], [place] (336) 641-6152 Accepts children up to age 21 who are enrolled in Medicaid or [place] Health Choice; pregnant women with a Medicaid card; and children who have applied for Medicaid or [place] Health Choice, but were declined, whose parents can pay a reduced fee at time of service.  °[place]  [facility] [place]  [address], [place] (336) 641-7733 Accepts children up to age 21 who are enrolled in Medicaid or [place] Health Choice; pregnant women with a Medicaid card; and children who have applied for Medicaid or [place] Health Choice, but were declined, whose parents can pay a reduced fee at time of service.  °[facility] ° [address], [place] (336) 641-4533 Patients are seen by appointment only. Walk-ins are not accepted. [facility] will see patients 18 years of age and older. °Monday - Tuesday (8am-5pm) °Most Wednesdays (8:30-5pm) °$30 per visit, cash only  °[facility] ° [address], [place] (336) 641-4533 Patients are seen by appointment only. Walk-ins are not accepted. [facility] will see patients 18 years of age and older. °One   Wednesday Evening (Monthly: Volunteer Based).  $30 per visit, cash only  °[facility]  (919) [phone] for adults; Children under age 4, call Graduate Pediatric Dentistry at ([phone]) [phone]. Children aged 4-14, please call ([phone]) [phone] to request a pediatric application. ° Dental services are provided in all areas of dental care including fillings, crowns and bridges, complete and partial dentures, implants, gum treatment, root canals, and extractions. Preventive care is also provided. Treatment is provided to both adults and children. °Patients are selected via a lottery and there is often a waiting list. °  °[facility] [address], °[place] ° (336) 763-8833 [email or web address] °  °[facility] [address], [place], [place] (336)723-1848, Ext. 123 Second and Fourth Thursday of each month, opens at 6:30 AM; Clinic ends at 9 AM.  Patients are seen on a first-come first-served basis, and a limited number are seen during each clinic.  ° °[facility] ° [address], [place], [place] (336) 723-7904    Eligibility Requirements °You must have lived in [place], [place], or [place] counties for at least the last three months. °  You cannot be eligible for state or federal sponsored healthcare insurance, including Veterans Administration, Medicaid, or Medicare. °  You generally cannot be eligible for healthcare insurance through your employer.  °  How to apply: °Eligibility screenings are held every Tuesday and Wednesday afternoon from 1:00 pm until 4:00 pm. You do not need an appointment for the interview!  °[facility] [address], [place], [place] 336-631-2330   °[facility]  336-342-8273   °[facility]  336-703-3100   °[facility]  336-570-6415   ° °Behavioral Health Resources in the Community: °Intensive Outpatient Programs °Organization         Address  Phone  Notes  °[facility] [address], [place], [place] 336-878-6098   °[facility] [address], [place], [place] 336-832-9800   °[facility] [address], [place], [place] ° 336-882-2125   °[facility] [address],  °[place], [place] 1-800-853-5163 or 336-641-4981   °Substance Abuse Resources °Organization         Address  Phone  Notes  °[facility]  336-882-2125   °[facility]  336-784-9470   °[facility]  336-285-9073   °[facility]  336-845-3988   °Residential & Outpatient Substance Abuse Program  1-800-659-3381   °Psychological Services °Organization         Address  Phone  Notes  °[facility]  336- 832-9600   °[facility]  336- 378-7881   °[facility] [address], [place] 1-800-853-5163 or 336-641-4981   ° °Mobile Crisis Teams °Organization         Address  Phone  Notes  °[facility], Mobile Crisis Care Unit  1-877-626-1772   °Assertive °[facility] ° [address]  [place], [place] 336-834-9664   °Sharon DeEsch [address] °[place] 336-554-5454   ° °Self-Help/Support Groups °Organization         Address  Phone             Notes  °[facility] of  - variety of support groups  336- 373-1402 Call for more information  °Narcotics Anonymous (NA), [facility] [address], °[place]  2 meetings at this location  ° °  Residential Treatment Programs Organization         Address  Phone  Notes  [facility] [address],    [place]  [phone]   [facility]  [address], [place] [phone], [place], [place] [phone]   [facility] [address], [place] [phone]) [phone] Admissions: 8am-3pm M-F  [facility] [address]    [place], [place] 295-284-1324(718) [phone]   [facility] [address], [place], [place] [phone]) [phone]   [facility] [address]  [place], [place] [phone]   [facility] Intensive Outpatient [address], [place], [place] [phone]) [phone]   [facility] ([facility] [address]  [place], [place] [phone] or [phone]   [facility] [address] [place], [place] [phone] Accepts Medicaid  [facility] [address]  [place] [phone] Substance Abuse/Addiction Treatment   [facility] Organization         Address  Phone  Notes  [facility]  (947)452-1471(888) 612-641-5426   Angie FavaJulie Brannon, PhD [address], [address] [place], [place]   [phone](336) [phone] or [phone](336) [phone]   [facility]   [address] [place], [place] [phone]) [phone]   [facility] [address], [place], [place] [phone]) [phone] Insurance/Medicaid/sponsorship through [facility] [address]                                    [place], [place] [phone]) [phone] Therapy/tele-psych/case    [facility] [address]   [place], [place] [phone](336) [phone]    Dr. Lolly MustacheArfeen  [phone](336) [phone]   [facility] [place] [facility]  [employer] [facility] 1) [address], [place] 2) [address], [place] 3)  [address], [place] [phone](336) [phone](336) [phone]  [phone](336) [phone]   [facility] Child Abuse Hot[facility] 941-081-7757(336) (304) 872-0564 or [phone](336) [phone] (After Hours)       Call the outpatient resources given to you today if you want assistance to stop drinking alcohol.  Return to the Emergency Department immediately sooner if worsening.
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
## 2013-03-20 ENCOUNTER — Emergency Department (HOSPITAL_COMMUNITY)
Admission: EM | Admit: 2013-03-20 | Discharge: 2013-03-20 | Disposition: A | Discharge status: Home / Self Care | Payer: Self-pay | Attending: Emergency Medicine | Admitting: Emergency Medicine

## 2013-03-20 ENCOUNTER — Emergency Department (HOSPITAL_COMMUNITY): Payer: Self-pay

## 2013-03-20 ENCOUNTER — Encounter (HOSPITAL_COMMUNITY): Payer: Self-pay | Admitting: Emergency Medicine

## 2013-03-20 DIAGNOSIS — W208XXA Other cause of strike by thrown, projected or falling object, initial encounter: Secondary | ICD-10-CM | POA: Insufficient documentation

## 2013-03-20 DIAGNOSIS — F10929 Alcohol use, unspecified with intoxication, unspecified: Secondary | ICD-10-CM

## 2013-03-20 DIAGNOSIS — S9030XA Contusion of unspecified foot, initial encounter: Secondary | ICD-10-CM | POA: Insufficient documentation

## 2013-03-20 DIAGNOSIS — S9031XA Contusion of right foot, initial encounter: Secondary | ICD-10-CM

## 2013-03-20 DIAGNOSIS — Y9389 Activity, other specified: Secondary | ICD-10-CM | POA: Insufficient documentation

## 2013-03-20 DIAGNOSIS — F1010 Alcohol abuse, uncomplicated: Secondary | ICD-10-CM | POA: Insufficient documentation

## 2013-03-20 DIAGNOSIS — Y9289 Other specified places as the place of occurrence of the external cause: Secondary | ICD-10-CM | POA: Insufficient documentation

## 2013-03-20 NOTE — ED Provider Notes (Signed)
CSN: [id number]     Arrival date & time 03/20/13  0550 History   First MD Initiated Contact with Patient 03/20/13 [phone]     Chief Complaint  Patient presents with  . Foot Pain     (Consider location/radiation/quality/duration/timing/severity/associated sxs/prior Treatment) The history is provided by the patient. The history is limited by the condition of the patient.   Patient presents with right foot pain after a marble table fell on his right foot just prior to arrival. Patient states that it is not hurt right now but is going to her in the morning. He states is because he is currently intoxicated (ETOH).  Does admit to tenderness over the dorsal aspect of his foot.  Denies weakness or numbness of the foot, denies pain in his ankle or anywhere else.  Denies other injury.   Level V caveat for intoxication.   Past Medical History  Diagnosis Date  . Alcohol abuse    History reviewed. No pertinent past surgical history. No family history on file. History  Substance Use Topics  . Smoking status: Never Smoker   . Smokeless tobacco: Not on file  . Alcohol Use: Yes     Comment: six 24 oz cans of beer daily    Review of Systems  Unable to perform ROS: Other  Cardiovascular: Negative for chest pain.  Gastrointestinal: Negative for abdominal pain.  Musculoskeletal: Negative for arthralgias, back pain and neck pain.  Skin: Positive for color change.  Neurological: Negative for weakness and numbness.      Allergies  Review of patient's allergies indicates no known allergies.  Home Medications  No current outpatient prescriptions on file. BP 115/73  Pulse 93  Temp(Src) 98.4 F (36.9 C) (Oral)  Resp 16  SpO2 97% Physical Exam  Nursing note and vitals reviewed. Constitutional: He appears well-developed and well-nourished. No distress.  Intoxicated.  Speech is goal directed and logical but slightly slurred.   HENT:  Head: Normocephalic and atraumatic.  Neck: Neck supple.   Pulmonary/Chest: Effort normal.  Musculoskeletal:  Right foot:  Moves all toes, sensation intact, capillary refill < 2 seconds.  Dorsal aspect of right foot diffusely edematous with area of ecchymosis, mildly tender to palpation.  Right ankle and lower leg nontender, no edema, full AROM.  Pulses intact.   Neurological: He is alert.  Skin: He is not diaphoretic.    ED Course  Procedures (including critical care time) Labs Review Labs Reviewed - No data to display Imaging Review Dg Foot Complete Right  03/20/2013   CLINICAL DATA:  Foot pain.  Trauma.  EXAM: RIGHT FOOT COMPLETE - 3+ VIEW  COMPARISON:  None.  FINDINGS: Dorsal mid and forefoot soft tissue swelling. Probable lateral heel laceration. No radiodense foreign body. Small ossific density between the base of the first and second metatarsals appears corticated, and is likely a small accessory ossicle. The Lisfranc joint is normally aligned. No acute fracture. No evidence of arthropathy.  IMPRESSION: No acute osseous abnormality.   Electronically Signed   By: Tiburcio Pea M.D.   On: 03/20/2013 06:44     EKG Interpretation None      MDM   Final diagnoses:  Contusion of right foot  Alcohol intoxication    Pt with injury to right dorsal foot after a marble table fell on it.  Denies other injuries.  Mild tenderness only over dorsal foot where there is swelling and ecchymosis.  No noted lacerations or break in skin.  Xray negative for fracture.  Placed in postop shoe and d/c home with resources given for follow up.  Discussed result, findings, treatment, and follow up  with patient.  Pt given return precautions.  Pt verbalizes understanding and agrees with plan.        OscoEmily Benen Weida, PA-C 03/20/13 [phone]

## 2013-03-20 NOTE — ED Notes (Signed)
[place], PA at bedside. 
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
## 2013-03-20 NOTE — ED Notes (Signed)
Post op shoe applied 

## 2013-03-20 NOTE — Discharge Instructions (Signed)
Read the information below.  You may return to the Emergency Department at any time for worsening condition or any new symptoms that concern you.  If you develop uncontrolled pain, weakness or numbness of the extremity, severe discoloration of the skin, or you are unable to walk, return to the ER for a recheck.   Lea la informacin a continuacin. Usted puede volver a la sala de [facility] en cualquier momento por el empeoramiento de la condicin o nuevos sntomas que le preocupan . Si desarrolla dolor incontrolable , debilidad o entumecimiento de las extremidades , severa decoloracin de la piel, o si no puede caminar , volver a la sala de emergencias de volver a examinar .   Utilice los siguientes recursos para obtener ayuda con la dependencia del alcohol   Intensive Outpatient Programs: [employer]      [address] [place], [place] [phone] Both a day and evening program       [facility] Outpatient     [address]        [place], [place] [address] [phone]         [facility]: [facility] [address] [place] [phone]  [facility] Mental Health ACCESS LINE: [phone] or [phone] [address] [place], [place] [address] [email or web address]  Mobile Crisis Teams:                                        [facility]         Mobile Crisis Care Unit [phone]             [facility] [address] Gin[place] Otto [phone]                                         [facility] [address] [place] [phone]  Self-Help/Support Groups: [facility] [employer] of support groups [phone] (call for more info)  Narcotics Anonymous (NA) [facility] [address] [place] - 2 meetings at this location  Residential Treatment Programs:  [facility]      [address]        [place]       [phone]         [facility] [address] [place] [address] [place], [place]  [address] [phone]  [facility]  [address] [place], [place] [address] [phone] Admissions: 8am-3pm M-F  [facility]     [address]        [place], [place] [address]       [phone]         [facility] [address] [place], [place] [phone]  [facility] [address] [place], [place] [phone]  [facility] - Intensive Outpatient      [address]     [place], [place]       [phone]         [facility] ([facility]     [address] [place], [place] [phone] or [phone]  [facility] ([facility])  [address] [place], [place] [phone]  [facility]  [address] [place] [phone]  [facility] Resources: [facility]- [phone]               General Therapy                                                Angie FavaJulie Brannon, PhD        [address]                                       [place], [place] [address]         [phone]   Insurance  [facility]   [address] [place], [place] [address] [phone]  [facility] [address] [place], [place] [address] [phone] Insurance/Medicaid/sponsorship through [facility]  [facility]                                              [address]                                        [place], [place] [address]    Therapy/tele-psych/case         [phone]          [facility] [address]   [place], [place]  [address]  Adolescent/group home/case management [phone]                                           Creola CornJulia Brannon PhD       General  therapy       Insurance   [phone]         Dr. Lolly MustacheArfeen Insurance [phone]- [phone] M-F  [facility] Medicaid, sponsorship [phone]

## 2013-03-21 ENCOUNTER — Emergency Department (HOSPITAL_COMMUNITY): Payer: Self-pay

## 2013-03-21 ENCOUNTER — Encounter (HOSPITAL_COMMUNITY): Payer: Self-pay | Admitting: Emergency Medicine

## 2013-03-21 ENCOUNTER — Emergency Department (HOSPITAL_COMMUNITY)
Admission: EM | Admit: 2013-03-21 | Discharge: 2013-03-21 | Disposition: A | Discharge status: Home / Self Care | Payer: Self-pay | Attending: Emergency Medicine | Admitting: Emergency Medicine

## 2013-03-21 DIAGNOSIS — R112 Nausea with vomiting, unspecified: Secondary | ICD-10-CM | POA: Insufficient documentation

## 2013-03-21 DIAGNOSIS — Y939 Activity, unspecified: Secondary | ICD-10-CM | POA: Insufficient documentation

## 2013-03-21 DIAGNOSIS — Z5900 Homelessness unspecified: Secondary | ICD-10-CM | POA: Insufficient documentation

## 2013-03-21 DIAGNOSIS — S91309A Unspecified open wound, unspecified foot, initial encounter: Secondary | ICD-10-CM | POA: Insufficient documentation

## 2013-03-21 DIAGNOSIS — Y929 Unspecified place or not applicable: Secondary | ICD-10-CM | POA: Insufficient documentation

## 2013-03-21 DIAGNOSIS — S99929A Unspecified injury of unspecified foot, initial encounter: Secondary | ICD-10-CM

## 2013-03-21 DIAGNOSIS — W208XXA Other cause of strike by thrown, projected or falling object, initial encounter: Secondary | ICD-10-CM | POA: Insufficient documentation

## 2013-03-21 MED ORDER — HYDROCODONE-ACETAMINOPHEN 5-325 MG PO TABS: 1.0000 | ORAL_TABLET | Freq: Four times a day (QID) | ORAL | Status: DC | PRN

## 2013-03-21 NOTE — ED Notes (Signed)
Bed: WA17 Expected date:  Expected time:  Means of arrival:  Comments: EMS 34M N/V Leg Pain

## 2013-03-21 NOTE — ED Provider Notes (Signed)
Patient was ambulated with crutches without difficulty.  He is alert and oriented.  The patient is advised followup with orthopedics.  Told to ice and elevate his foot.  Told to return here as needed.  Carlyle Dollyhristopher W Minah Axelrod, PA-C 03/21/13 1129

## 2013-03-21 NOTE — ED Notes (Signed)
Patient is alert and oriented x3.  He is complaining of right foot pain that radiates up the left side of his body. Patient right foot is discolored and swelling.  He states that he has not been able to fell his foot for the past day. He states that something accidentally fell on his foot.  He adds that he has had nausea and vomiting last night He states that he has drank two "Locos" last night.

## 2013-03-21 NOTE — Progress Notes (Signed)
[facility] provided pt with a list of primary care resources, highlighting [facility].  °

## 2013-03-21 NOTE — ED Provider Notes (Signed)
Medical screening examination/treatment/procedure(s) were performed by non-physician practitioner and as supervising physician I was immediately available for consultation/collaboration.   EKG Interpretation None        Jeanett Antonopoulos, MD 03/21/13 1609 

## 2013-03-21 NOTE — ED Provider Notes (Signed)
CSN: [id number]     Arrival date & time 03/21/13  0413 History   First MD Initiated Contact with Patient 03/21/13 [phone]     Chief Complaint  Patient presents with  . Nausea  . Emesis  . Foot Injury    Right foot, discolored, decreased sensation     (Consider location/radiation/quality/duration/timing/severity/associated sxs/prior Treatment) HPI Comments: Patient who is a intoxicated called EMS, from the homeless shelter, do, to increased pain in his right foot.  He, states that an object fell on his foot.  Several days ago, and has been having pain since he has not taken anything for pain, but he does, state that last night.  He drank 2 Locos, which are energy drinks with alcohol.  Patient is a 48 y.o. male presenting with foot injury. The history is provided by the patient.  Foot Injury Location:  Foot Time since incident:  2 days Injury: yes   Mechanism of injury comment:  Dropped object on foot Foot location:  R foot Pain details:    Quality:  Aching (numb)   Radiates to:  R leg   Severity:  Severe   Onset quality:  Gradual   Duration:  2 days   Timing:  Constant   Progression:  Worsening Chronicity:  New Foreign body present:  Unable to specify Tetanus status:  Unknown Prior injury to area:  Unable to specify Relieved by:  None tried Worsened by:  Bearing weight Ineffective treatments:  None tried Associated symptoms: decreased ROM, numbness and swelling   Associated symptoms: no fever   Risk factors: no obesity     History reviewed. No pertinent past medical history. History reviewed. No pertinent past surgical history. History reviewed. No pertinent family history. History  Substance Use Topics  . Smoking status: Never Smoker   . Smokeless tobacco: Not on file  . Alcohol Use: Yes    Review of Systems  Constitutional: Negative for fever.  Respiratory: Negative for shortness of breath.   Cardiovascular: Negative for leg swelling.  Musculoskeletal: Positive  for joint swelling.  Skin: Positive for color change.  Neurological: Negative for dizziness.  All other systems reviewed and are negative.      Allergies  Review of patient's allergies indicates no known allergies.  Home Medications   Current Outpatient Rx  Name  Route  Sig  Dispense  Refill  . HYDROcodone-acetaminophen (NORCO/VICODIN) 5-325 MG per tablet   Oral   Take 1 tablet by mouth every 6 (six) hours as needed for moderate pain.   15 tablet   0    BP 107/66  Pulse 80  Temp(Src) 98.9 F (37.2 C) (Oral)  Resp 19  SpO2 96% Physical Exam  Constitutional: He is oriented to person, place, and time. He appears well-developed and well-nourished.  HENT:  Head: Normocephalic.  Neck: Normal range of motion.  Cardiovascular: Normal rate.   Pulmonary/Chest: Effort normal.  Musculoskeletal: He exhibits edema and tenderness.  Neurological: He is alert and oriented to person, place, and time.  Skin: Skin is warm.  Swelling and bruising of right foot    ED Course  Procedures (including critical care time) Labs Review Labs Reviewed - No data to display Imaging Review No results found.   EKG Interpretation None      MDM   Final diagnoses:  Soft tissue injury of foot        Arman FilterGail K Nyimah Shadduck, NP 03/24/13 2208

## 2013-03-21 NOTE — ED Notes (Signed)
Pt ambulated to restroom w/ crutches w/o difficulty.  PA notified.

## 2013-03-21 NOTE — ED Notes (Signed)
Ortho tech at bedside 

## 2013-03-21 NOTE — Discharge Instructions (Signed)
Return here as needed. Ice and elevate your foot. Follow up with the orthopedist provided.

## 2013-03-21 NOTE — ED Notes (Signed)
Per EMS, patient picked up at homeless shelter, c/o nausea and vomiting. Patient ambulatory on EMS arrival. Removed patient boot on arrival to ED, foot is noted to be discolored with decreased sensation.

## 2013-03-21 NOTE — ED Notes (Signed)
Per PA, waiting on pt to sober up. Pt given 8oz juice.

## 2013-03-22 NOTE — ED Provider Notes (Signed)
Medical screening examination/treatment/procedure(s) were performed by non-physician practitioner and as supervising physician I was immediately available for consultation/collaboration.   EKG Interpretation None        Ramesha Poster, MD 03/22/13 2307 

## 2013-03-25 NOTE — ED Provider Notes (Signed)
Medical screening examination/treatment/procedure(s) were performed by non-physician practitioner and as supervising physician I was immediately available for consultation/collaboration.   EKG Interpretation None        Teagyn Fishel S Kileen Lange, MD 03/25/13 0750 

## 2013-05-23 ENCOUNTER — Encounter (HOSPITAL_COMMUNITY): Payer: Self-pay | Admitting: Emergency Medicine

## 2013-05-23 ENCOUNTER — Emergency Department (HOSPITAL_COMMUNITY): Payer: Self-pay

## 2013-05-23 ENCOUNTER — Emergency Department (HOSPITAL_COMMUNITY)
Admission: EM | Admit: 2013-05-23 | Discharge: 2013-05-24 | Disposition: A | Discharge status: Home / Self Care | Payer: Self-pay | Attending: Emergency Medicine | Admitting: Emergency Medicine

## 2013-05-23 DIAGNOSIS — Y9289 Other specified places as the place of occurrence of the external cause: Secondary | ICD-10-CM | POA: Insufficient documentation

## 2013-05-23 DIAGNOSIS — F10929 Alcohol use, unspecified with intoxication, unspecified: Secondary | ICD-10-CM

## 2013-05-23 DIAGNOSIS — S99929A Unspecified injury of unspecified foot, initial encounter: Principal | ICD-10-CM

## 2013-05-23 DIAGNOSIS — IMO0002 Reserved for concepts with insufficient information to code with codable children: Secondary | ICD-10-CM | POA: Insufficient documentation

## 2013-05-23 DIAGNOSIS — S99911A Unspecified injury of right ankle, initial encounter: Secondary | ICD-10-CM

## 2013-05-23 DIAGNOSIS — F1010 Alcohol abuse, uncomplicated: Secondary | ICD-10-CM | POA: Insufficient documentation

## 2013-05-23 DIAGNOSIS — S99921A Unspecified injury of right foot, initial encounter: Secondary | ICD-10-CM

## 2013-05-23 DIAGNOSIS — S99919A Unspecified injury of unspecified ankle, initial encounter: Principal | ICD-10-CM

## 2013-05-23 DIAGNOSIS — S8990XA Unspecified injury of unspecified lower leg, initial encounter: Secondary | ICD-10-CM | POA: Insufficient documentation

## 2013-05-23 DIAGNOSIS — Y9389 Activity, other specified: Secondary | ICD-10-CM | POA: Insufficient documentation

## 2013-05-23 NOTE — ED Provider Notes (Signed)
CSN: [id number]     Arrival date & time 05/23/13  2240 History   First MD Initiated Contact with Patient 05/23/13 2255     Chief Complaint  Patient presents with  . Ankle Pain  . Alcohol Intoxication     (Consider location/radiation/quality/duration/timing/severity/associated sxs/prior Treatment) HPI  48 year old male was brought in via EMS with complaints of right ankle injury. History is limited as patient appears to be intoxicated. Patients states he is having pain to his right ankle. Nursing note mentioned that someone had stepped on his foot however when I asked, he cannot give me a straight answer.  Patient mentioned that he had injured his right ankle 3 months ago from an accident, unable to tell me exactly what happened. States he has been having pain ever since. States he is wearing a Cam Walker but unable to tell me where he got it from.  Admits to drinking a large amount of alcohol but unable to tell me how much. He pointed toward his R ankle/foot and complaining of pain. Denies any knee or hip pain. Denies any other injury.  History r eviewed. No pertinent past medical history. History reviewed. No pertinent past surgical history. History reviewed. No pertinent family history. History  Substance Use Topics  . Smoking status: Never Smoker   . Smokeless tobacco: Not on file  . Alcohol Use: Yes    Review of Systems  Unable to perform ROS intoxication    Allergies  Review of patient's allergies indicates no known allergies.  Home Medications   Prior to Admission medications   Not on File   BP 151/101  Pulse 90  Temp(Src) 97.9 F (36.6 C) (Oral)  Resp 16  SpO2 96% Physical Exam  Constitutional: He appears well-developed and well-nourished. No distress.  Appears intoxicated, EtOH breath  HENT:  Head: Normocephalic and atraumatic.  Eyes: Conjunctivae are normal.  Neck: Normal range of motion. Neck supple.  No nuchal rigidity   Musculoskeletal: He exhibits  tenderness (Tenderness to lateral malleolar R ankle and also to proximal dorsum of right foot with full range of motion and intact dorsalis pedis and brisk cap refill.). He exhibits no edema.  Neurological: He is alert.  Skin: No rash noted.  Psychiatric: He has a normal mood and affect.    ED Course  Procedures (including critical care time)  Pt here with c/o R ankle, R foot pain from injury.  He has a cam walker with him.  He does have mild edema noted to dorsum of proximal R foot and tenderness to lateral malleolar R ankle.  Xray of R ankle is negative for acute fx.  Pt currently intoxicated, but suspect ankle/foot sprain.  Will apply ACE wrap and place ankle back into his Cam Walker.  Will d/c pt once he is clinically sober.  Pt is homeless.  Care discussed with Earley FavorGail Schulz who will d/c pt once appropriate.    Labs Review Labs Reviewed - No data to display  Imaging Review Dg Ankle Complete Right  05/23/2013   CLINICAL DATA:  Pain and swelling  EXAM: RIGHT ANKLE - COMPLETE 3+ VIEW  COMPARISON:  DG FOOT COMPLETE*R* dated 03/21/2013  FINDINGS: There is no evidence of fracture, dislocation, or joint effusion. There is no evidence of arthropathy or other focal bone abnormality. Soft tissues are unremarkable.  IMPRESSION: No acute osseous injury of the right ankle.   Electronically Signed   By: Elige KoHetal  Patel   On: 05/23/2013 23:38     EKG  Interpretation None      MDM   Final diagnoses:  Injury of right ankle and foot  Alcohol intoxication    BP 151/101  Pulse 90  Temp(Src) 97.9 F (36.6 C) (Oral)  Resp 16  SpO2 96%  I have reviewed nursing notes and vital signs. I personally reviewed the imaging tests through PACS system  I reviewed available ER/hospitalization records thought the EMR     Fayrene HelperBowie Marsa Matteo, [place] JerseyPA-C 05/24/13 0036

## 2013-05-23 NOTE — ED Notes (Signed)
Bed: WA06 Expected date:  Expected time:  Means of arrival:  Comments: EMS 

## 2013-05-23 NOTE — ED Notes (Addendum)
Pt brought in by EMS. Pt c/o of R ankle pain from someone stepping on his foot. No deformity noted to ankle area. Pt with ortho boot in place. Pt has EtOH on board. Pt alert and brought in by wheel chair.

## 2013-05-24 NOTE — ED Provider Notes (Signed)
Medical screening examination/treatment/procedure(s) were performed by non-physician practitioner and as supervising physician I was immediately available for consultation/collaboration.   EKG Interpretation None       Dosia Yodice M Carianna Lague, MD 05/24/13 0247 

## 2013-05-24 NOTE — Discharge Instructions (Signed)
Elastic Bandage and RICE Elastic bandages come in different shapes and sizes. They perform different functions. Your caregiver will help you to decide what is best for your protection, recovery, or rehabilitation following an injury. The following are some general tips to help you use an elastic bandage.  Use the bandage as directed by the maker of the bandage you are using.  Do not wrap it too tight. This may cut off the circulation of the arm or leg below the bandage.  If part of your body beyond the bandage becomes blue, numb, or swollen, it is too tight. Loosen the bandage as needed to prevent these problems.  See your caregiver or trainer if the bandage seems to be making your problems worse rather than better. Bandages may be a reminder to you that you have an injury. However, they provide very little support. The few pounds of support they provide are minor considering the pressure it takes to injure a joint or tear ligaments. Therefore, the joint will not be able to handle all of the wear and tear it could before the injury. The routine care of many injuries includes Rest, Ice, Compression, and Elevation (RICE).  Rest is required to allow your body to heal. Generally, routine activities can be resumed when comfortable. Injured tendons and bones take about 6 weeks to heal.  Icing the injury helps keep the swelling down and reduces pain. Do not apply ice directly to the skin. Put ice in a plastic bag. Place a towel between the skin and the bag. This will prevent frostbite to the skin. Apply ice bags to the injured area for 15-20 minutes, every 2 hours while awake. Do this for the first 24 to 48 hours, then as directed by your caregiver.  Compression helps keep swelling down, gives support, and helps with discomfort. If an elastic bandage has been applied today, it should be removed and reapplied every 3 to 4 hours. It should not be applied tightly, but firmly enough to keep swelling down.  Watch fingers or toes for swelling, bluish discoloration, coldness, numbness, or increased pain. If any of these problems occur, remove the bandage and reapply it more loosely. If these problems persist, contact your caregiver.  Elevation helps reduce swelling and decreases pain. The injured area (arms, hands, legs, or feet) should be placed near to or above the heart (center of the chest) if able. Persistent pain and inability to use the injured area for more than 2 to 3 days are warning signs. You should see a caregiver for a follow-up visit as soon as possible. Initially, a minor broken bone (hairline fracture) may not be seen on X-rays. It may take 7 to 10 days to finally show up. Continued pain and swelling show that further evaluation and/or X-rays are needed. Make a follow-up visit with your caregiver. A specialist in reading X-rays (radiologist) will read your X-rays again. Finding out the results of your test Not all test results are available during your visit. If your test results are not back during the visit, make an appointment with your caregiver to find out the results. Do not assume everything is normal if you have not heard from your caregiver or the medical facility. It is important for you to follow up on all of your test results. Document Released: 06/27/2001 Document Revised: 03/30/2011 Document Reviewed: 05/09/2007 [facility] Patient Information 2014 [place], [place].   Alcohol Intoxication Alcohol intoxication occurs when the amount of alcohol that a person has consumed impairs  his or her ability to mentally and physically function. Alcohol directly impairs the normal chemical activity of the brain. Drinking large amounts of alcohol can lead to changes in mental function and behavior, and it can cause many physical effects that can be harmful.  Alcohol intoxication can range in severity from mild to very severe. Various factors can affect the level of intoxication that occurs, such  as the person's age, gender, weight, frequency of alcohol consumption, and the presence of other medical conditions (such as diabetes, seizures, or heart conditions). Dangerous levels of alcohol intoxication may occur when people drink large amounts of alcohol in a short period (binge drinking). Alcohol can also be especially dangerous when combined with certain prescription medicines or "recreational" drugs. SIGNS AND SYMPTOMS Some common signs and symptoms of mild alcohol intoxication include:  Loss of coordination.  Changes in mood and behavior.  Impaired judgment.  Slurred speech. As alcohol intoxication progresses to more severe levels, other signs and symptoms will appear. These may include:  Vomiting.  Confusion and impaired memory.  Slowed breathing.  Seizures.  Loss of consciousness. DIAGNOSIS  Your health care provider will take a medical history and perform a physical exam. You will be asked about the amount and type of alcohol you have consumed. Blood tests will be done to measure the concentration of alcohol in your blood. In many places, your blood alcohol level must be lower than 80 mg/dL (9.24%0.08%) to legally drive. However, many dangerous effects of alcohol can occur at much lower levels.  TREATMENT  People with alcohol intoxication often do not require treatment. Most of the effects of alcohol intoxication are temporary, and they go away as the alcohol naturally leaves the body. Your health care provider will monitor your condition until you are stable enough to go home. Fluids are sometimes given through an IV access tube to help prevent dehydration.  HOME CARE INSTRUCTIONS  Do not drive after drinking alcohol.  Stay hydrated. Drink enough water and fluids to keep your urine clear or pale yellow. Avoid caffeine.   Only take over-the-counter or prescription medicines as directed by your health care provider.  SEEK MEDICAL CARE IF:   You have persistent vomiting.    You do not feel better after a few days.  You have frequent alcohol intoxication. Your health care provider can help determine if you should see a substance use treatment counselor. SEEK IMMEDIATE MEDICAL CARE IF:   You become shaky or tremble when you try to stop drinking.   You shake uncontrollably (seizure).   You throw up (vomit) blood. This may be bright red or may look like black coffee grounds.   You have blood in your stool. This may be bright red or may appear as a black, tarry, bad smelling stool.   You become lightheaded or faint.  MAKE SURE YOU:   Understand these instructions.  Will watch your condition.  Will get help right away if you are not doing well or get worse. Document Released: 10/15/2004 Document Revised: 09/07/2012 Document Reviewed: 06/10/2012 [facility] Patient Information 2014 [place], [place].

## 2013-06-07 ENCOUNTER — Emergency Department (HOSPITAL_COMMUNITY)
Admission: EM | Admit: 2013-06-07 | Discharge: 2013-06-07 | Disposition: A | Discharge status: Home / Self Care | Payer: Self-pay | Attending: Emergency Medicine | Admitting: Emergency Medicine

## 2013-06-07 ENCOUNTER — Encounter (HOSPITAL_COMMUNITY): Payer: Self-pay | Admitting: Emergency Medicine

## 2013-06-07 DIAGNOSIS — B351 Tinea unguium: Secondary | ICD-10-CM | POA: Insufficient documentation

## 2013-06-07 DIAGNOSIS — Z5900 Homelessness unspecified: Secondary | ICD-10-CM | POA: Insufficient documentation

## 2013-06-07 HISTORY — DX: Homelessness: Z59.0

## 2013-06-07 HISTORY — DX: Homelessness unspecified: Z59.00

## 2013-06-07 MED ORDER — TERBINAFINE HCL 1 % EX CREA
TOPICAL_CREAM | Freq: Two times a day (BID) | CUTANEOUS | Status: DC
  Filled 2013-06-07: qty 12

## 2013-06-07 MED ORDER — TERBINAFINE HCL 1 % EX CREA: 1.0000 "application " | TOPICAL_CREAM | Freq: Two times a day (BID) | CUTANEOUS | Status: DC

## 2013-06-07 MED ORDER — CEPHALEXIN 250 MG PO CAPS
500.0000 mg | ORAL_CAPSULE | Freq: Once | ORAL | Status: AC
  Administered 2013-06-07: 500 mg via ORAL
  Filled 2013-06-07: qty 2

## 2013-06-07 MED ORDER — CEPHALEXIN 500 MG PO CAPS: 500.0000 mg | ORAL_CAPSULE | Freq: Four times a day (QID) | ORAL | Status: DC

## 2013-06-07 MED ORDER — IBUPROFEN 800 MG PO TABS
800.0000 mg | ORAL_TABLET | Freq: Once | ORAL | Status: AC
Start: 2013-06-07 — End: 2013-06-07
  Administered 2013-06-07: 800 mg via ORAL
  Filled 2013-06-07: qty 1

## 2013-06-07 NOTE — ED Provider Notes (Signed)
CSN: [id number]     Arrival date & time 06/07/13  [id number] History   First MD Initiated Contact with Patient 06/07/13 [phone]     Chief Complaint  Patient presents with  . Foot Pain     (Consider location/radiation/quality/duration/timing/severity/associated sxs/prior Treatment) HPI History provided by patient. Patient is homeless, spends a lot of time on his feet. He presents with ongoing bilateral foot pain, present for months, worse tonight and more so in his right foot. No fevers or chills. No direct trauma. No calf pain or leg swelling. Pain worse with ambulation. Moderate in severity. Past Medical History  Diagnosis Date  . Alcohol abuse   . Homeless    History reviewed. No pertinent past surgical history. No family history on file. History  Substance Use Topics  . Smoking status: Never Smoker   . Smokeless tobacco: Not on file  . Alcohol Use: Yes     Comment: six 24 oz cans of beer daily    Review of Systems  Constitutional: Negative for fever and chills.  Respiratory: Negative for shortness of breath.   Cardiovascular: Negative for chest pain.  Gastrointestinal: Negative for abdominal pain.  Musculoskeletal: Negative for back pain.  Skin: Positive for rash.  Neurological: Negative for weakness and numbness.  All other systems reviewed and are negative.     Allergies  Review of patient's allergies indicates no known allergies.  Home Medications   Prior to Admission medications   Not on File   BP 147/111  Pulse 94  Temp(Src) 97.9 F (36.6 C) (Oral)  Resp 14  SpO2 96% Physical Exam  Constitutional: He is oriented to person, place, and time. He appears well-developed and well-nourished.  HENT:  Head: Normocephalic and atraumatic.  Eyes: EOM are normal. Pupils are equal, round, and reactive to light.  Neck: Neck supple.  Cardiovascular: Regular rhythm and intact distal pulses.   Pulmonary/Chest: Effort normal. No respiratory distress.  Musculoskeletal: Normal  range of motion.  Right greater the left foot erythema, dry skin with areas of cracking. Obvious sequela of poor hygiene. Equal dorsalis pedis pulses. Distal motor and sensorium intact. No bony deformities. No increased warmth to touch. No lower extremity tenderness, erythema or cords  Neurological: He is alert and oriented to person, place, and time.  Skin: Skin is warm and dry.    ED Course  Procedures (including critical care time) Labs Review Labs Reviewed - No data to display  Imaging Review No results found.  Feet were cleaned with soap and water Medications provided - lamosil and kelfex  Hygiene instructions. Infection instructions and precautions. [facility] - patient encouraged to call to make an appointment for close followup. Prescriptions provided. MDM   Diagnosis: Fungal infection bilateral feet  Homeless patient with history of alcohol abuse presents with bilateral foot pain, clinical fungal infection. Medications provided. Vital signs and nursing notes reviewed and considered.    Sunnie NielsenBrian Liesel Peckenpaugh, MD 06/07/13 [phone]

## 2013-06-07 NOTE — Discharge Instructions (Signed)
Ringworm, Nail A fungal infection of the nail (tinea unguium/onychomycosis) is common. It is common as the visible part of the nail is composed of dead cells which have no blood supply to help prevent infection. It occurs because fungi are everywhere and will pick any opportunity to grow on any dead material. Because nails are very slow growing they require up to 2 years of treatment with anti-fungal medications. The entire nail back to the base is infected. This includes approximately  of the nail which you cannot see. If your caregiver has prescribed a medication by mouth, take it every day and as directed. No progress will be seen for at least 6 to 9 months. Do not be disappointed! Because fungi live on dead cells with little or no exposure to blood supply, medication delivery to the infection is slow; thus the cure is slow. It is also why you can observe no progress in the first 6 months. The nail becoming cured is the base of the nail, as it has the blood supply. Topical medication such as creams and ointments are usually not effective. Important in successful treatment of nail fungus is closely following the medication regimen that your doctor prescribes. Sometimes you and your caregiver may elect to speed up this process by surgical removal of all the nails. Even this may still require 6 to 9 months of additional oral medications. See your caregiver as directed. Remember there will be no visible improvement for at least 6 months. See your caregiver sooner if other signs of infection (redness and swelling) develop. Document Released: 01/03/2000 Document Revised: 03/30/2011 Document Reviewed: 03/13/2008 [employer] Patient Information 2014 [employer].  
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
## 2013-06-07 NOTE — ED Notes (Signed)
This RN cleaned the pt's feet to better see the condition they are in. This RN found that the pt's skin is dry and flaky, with multiple fissures in the skin. There are also multiple black masses hanging from the dead skin. Pt also has an severe ingrown toenail on his left great toe, and the nail on the pt's right little toe appears to be falling off. Toenails are overgrown with detritus underneath each nail. Pt's right foot appears swollen.

## 2013-07-17 ENCOUNTER — Emergency Department (HOSPITAL_COMMUNITY)
Admission: EM | Admit: 2013-07-17 | Discharge: 2013-07-18 | Disposition: A | Discharge status: Home / Self Care | Payer: Self-pay | Attending: Emergency Medicine | Admitting: Emergency Medicine

## 2013-07-17 ENCOUNTER — Encounter (HOSPITAL_COMMUNITY): Payer: Self-pay | Admitting: Emergency Medicine

## 2013-07-17 DIAGNOSIS — S0990XA Unspecified injury of head, initial encounter: Secondary | ICD-10-CM | POA: Insufficient documentation

## 2013-07-17 DIAGNOSIS — S9030XA Contusion of unspecified foot, initial encounter: Secondary | ICD-10-CM | POA: Insufficient documentation

## 2013-07-17 DIAGNOSIS — F1010 Alcohol abuse, uncomplicated: Secondary | ICD-10-CM | POA: Insufficient documentation

## 2013-07-17 DIAGNOSIS — F10920 Alcohol use, unspecified with intoxication, uncomplicated: Secondary | ICD-10-CM
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
## 2013-07-17 NOTE — ED Notes (Signed)
Bed: [facility]2 Expected date:  Expected time:  Means of arrival:  Comments: EMS 

## 2013-07-18 ENCOUNTER — Encounter (HOSPITAL_COMMUNITY): Payer: Self-pay | Admitting: Emergency Medicine

## 2013-07-18 ENCOUNTER — Emergency Department (HOSPITAL_COMMUNITY): Payer: Self-pay

## 2013-07-18 LAB — URINALYSIS, ROUTINE W REFLEX MICROSCOPIC
Bilirubin Urine: NEGATIVE
GLUCOSE, UA: NEGATIVE mg/dL
Ketones, ur: NEGATIVE mg/dL
LEUKOCYTES UA: NEGATIVE
Nitrite: NEGATIVE
Protein, ur: NEGATIVE mg/dL
Specific Gravity, Urine: 1.001 — ABNORMAL LOW (ref 1.005–1.030)
Urobilinogen, UA: 0.2 mg/dL (ref 0.0–1.0)
pH: 6 (ref 5.0–8.0)

## 2013-07-18 LAB — CBC WITH DIFFERENTIAL/PLATELET
Basophils Absolute: 0 10*3/uL (ref 0.0–0.1)
Basophils Relative: 0 % (ref 0–1)
EOS PCT: 2 % (ref 0–5)
Eosinophils Absolute: 0.1 10*3/uL (ref 0.0–0.7)
HCT: 39.3 % (ref 39.0–52.0)
Hemoglobin: 14.3 g/dL (ref 13.0–17.0)
LYMPHS PCT: 45 % (ref 12–46)
Lymphs Abs: 2.1 10*3/uL (ref 0.7–4.0)
MCH: 33.3 pg (ref 26.0–34.0)
MCHC: 36.4 g/dL — ABNORMAL HIGH (ref 30.0–36.0)
MCV: 91.6 fL (ref 78.0–100.0)
Monocytes Absolute: 0.2 10*3/uL (ref 0.1–1.0)
Monocytes Relative: 5 % (ref 3–12)
NEUTROS ABS: 2.3 10*3/uL (ref 1.7–7.7)
NEUTROS PCT: 48 % (ref 43–77)
PLATELETS: 200 10*3/uL (ref 150–400)
RBC: 4.29 MIL/uL (ref 4.22–5.81)
RDW: 13 % (ref 11.5–15.5)
WBC: 4.8 10*3/uL (ref 4.0–10.5)

## 2013-07-18 LAB — ETHANOL: Alcohol, Ethyl (B): 407 mg/dL (ref 0–11)

## 2013-07-18 LAB — COMPREHENSIVE METABOLIC PANEL
ALK PHOS: 90 U/L (ref 39–117)
ALT: 20 U/L (ref 0–53)
AST: 33 U/L (ref 0–37)
Albumin: 4.4 g/dL (ref 3.5–5.2)
BUN: 8 mg/dL (ref 6–23)
CO2: 26 meq/L (ref 19–32)
Calcium: 8.8 mg/dL (ref 8.4–10.5)
Chloride: 100 mEq/L (ref 96–112)
Creatinine, Ser: 0.94 mg/dL (ref 0.50–1.35)
Glucose, Bld: 98 mg/dL (ref 70–99)
POTASSIUM: 3.4 meq/L — AB (ref 3.7–5.3)
SODIUM: 142 meq/L (ref 137–147)
Total Bilirubin: 0.4 mg/dL (ref 0.3–1.2)
Total Protein: 7.7 g/dL (ref 6.0–8.3)

## 2013-07-18 LAB — URINE MICROSCOPIC-ADD ON

## 2013-07-18 LAB — RAPID URINE DRUG SCREEN, HOSP PERFORMED
Amphetamines: NOT DETECTED
Barbiturates: NOT DETECTED
Benzodiazepines: NOT DETECTED
Cocaine: NOT DETECTED
Opiates: NOT DETECTED
Tetrahydrocannabinol: NOT DETECTED

## 2013-07-18 LAB — LIPASE, BLOOD: LIPASE: 63 U/L — AB (ref 11–59)
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
## 2013-07-18 NOTE — ED Notes (Signed)
Pt is asking to use urinal, explained to pt, due to him lying in the hallway bed it would be more appropriate for him to go to bathroom. Pt started yelling that he can't walk, however after few minutes he got up without any difficulty, went to bathroom and then attempted to leave, because "he was mad for having to walk". PA Theron Aristaeter spoke to pt and asked him does he wants to stay or leave, pt sts he wants to stay and sleep. Pt went back to bed, and is complaining because people are talking too loud and light is too bright for him to sleep. Reminded pt that he is in the emergency room and we can't  turn the lights off or prevent noise. Pt then called this RN "stupid woman" and turned on the other side.

## 2013-07-18 NOTE — Discharge Instructions (Signed)
Intoxicacin alcohlica (Alcohol Intoxication) La intoxicacin alcohlica ocurre cuando ha bebido la cantidad de alcohol suficiente para afectar su desenvolvimiento. Puede ser leve o muy grave. Beber gran cantidad de alcohol en un corto plazo se denomina borrachera. Puede ser Black & Deckermuy nociva. Beber alcohol tambin puede ser muy peligroso si toma medicamentos o [place]) Islandsutiliza otras drogas. Algunos de los efectos causados por el alcohol son:  Prdida de la coordinacin.  Cambios en el estado de nimo y la conducta.  Pensamiento confuso.  Dificultad para hablar (arrastrar las palabras).  Devolver la comida (vomitar).  Confusin.  Disminucin de Engineer, manufacturing systemsla frecuencia respiratoria.  Sacudidas y temblores (convulsiones).  Prdida de la conciencia. CUIDADOS EN EL HOGAR  No conduzca vehculos despus de beber alcohol.  Beba gran cantidad de lquido para mantener el pis (orina) de tono claro o de color amarillo plido. Evite la cafena.  Slo tome los medicamentos que le haya indicado su mdico. SOLICITE AYUDA SI:  Devuelve (vomita) repetidas veces.  No mejora luego de Time Warneralgunos das.  Se intoxica con alcohol con frecuencia. El mdico podr ayudarlo a decidir si debe consultar a un terapeuta especializado en el abuso de sustancias. SOLICITE AYUDA DE INMEDIATO SI:  Siente temblores cuando deja de beber.  Tiene temblores o sacudidas.  Vomita sangre. Puede ser de color rojo brillante o similar a la borra del caf.  Nota sangre en las heces (movimiento intestinal).  Se siente mareado o se desvanece (se desmaya). ASEGRESE DE QUE:   Comprende estas instrucciones.  Controlar su afeccin.  Recibir ayuda de inmediato si no mejora o si empeora. Document Released: 02/07/2010 Document Revised: 09/07/2012 [facility] Patient Information 2015 [place]ExitCare, [place]LL[place]. This information is not intended to replace advice given to you by your health care provider. Make sure you discuss any questions you have with your  health care provider.    Problemas Con El Alcohol (Alcohol Problems) La mayora de los adultos que beben alcohol lo hacen con moderacin (no demasiado) y poseen bajo riesgo de Warehouse managertener problemas relacionados con la bebida. Sin embargo, todos los bebedores, inclusive los de bajo riesgo, Gafferdeberan conocer los riesgos de la salud que conlleva la ingesta de alcohol. RECOMENDACIONES PARA LOS BEBEDORES DE BAJO RIESGO Beber con moderacin. La ingesta moderada de alcohol se establece de la siguiente manera:   Hombres - no ms de dos tragos Googlepor da.  Mujeres - no ms de un trago Air cabin crewpor da.  Mayores de 65 aos - no ms de un trago Air cabin crewpor da. Un trago estndar es 12 gramos de alcohol puro, lo que es lo mismo que una botella de 12 onzas de cerveza o Merchant navy officercleric, un vaso de vino de 5 onzas, o 1 onza y media de bebidas blancas (como whisky, brandy, vodka, o ron).  ABSTNGASE (NO BEBA) ALCOHOL:  Si est embarazada o contempla la posibilidad.  Cuando tome un medicamento que interacta con el alcohol.  Si usted es dependiente del alcohol.  Sufre alguna enfermedad en la que se prohba el consumo de alcohol (como lceras, enfermedades hepticas, o enfermedades cardacas). HABLE CON EL MDICO:  Si tiene riesgo de sufrir una enfermedad coronaria, converse sobre los potenciales beneficios y riesgos de la ingesta de alcohol: La ingesta baja a moderada de alcohol est asociada con tasas menores de enfermedades coronarias en ciertas poblaciones (por ejemplo, hombres mayores de 45 aos y mujeres postmenopusicas). Se aconseja a las personas no bebedoras o abstemias no comenzar con la ingesta baja a moderada para reducir el riesgo de enfermedades coronarias en funcin de  evitar la aparicin de un problema relacionado con el alcohol. Pueden obtenerse efectos protectores similares a travs de una dieta y ejercicios adecuados.  Las mujeres y los ancianos tienen menos cantidad de agua en el Alcoa Incorganismo que los hombres. Como  consecuencia de Farmlandesto, las mujeres y los ancianos tienen mayor concentracin de alcohol en la sangre despus de beber la misma cantidad de alcohol.  La exposicin del feto al alcohol puede ocasionar una gran cantidad de defectos de nacimientos dominados [place] Alcohlico Fetal (FAS) o Defectos de Nacimiento Relacionados con el Alcohol (ARBD). Aunque los FAS y los ARBD estn asociados con el consumo excesivo de alcohol durante el Garrisonembarazo, tambin se han informado trastornos de Leisure centre managerconducta en nios nacidos de madres que informaron haber bebido un trago en promedio por Mudloggerda durante el embarazo.  El abuso de alcohol (como el consumo de ms de cuatro tragos por ocasin en hombres y ms de tres tragos por ocasin en mujeres) perjudica a lo cognitivo (aprendizaje) y las funciones psicomotoras e incrementa el riesgo de problemas relacionados con el alcohol, inclusive accidentes y daos. PREGUNTAS CECA:   Algunas vez ha sentido que deba cortar con la bebida?  Se ha enojado usted con alguien que lo critic por lo que bebe?  Alguna vez se ha sentido mal o culpable por lo que bebe?  Ha tomado alguna vez un trago por la maana para calmar sus nervios o para deshacerse de una "resaca" (para "abrir los ojos")? Si ha respondido de Hondurasmanera positiva a Jerseyalguna de estas preguntas: Podra estar en riesgo de tener problemas relacionados con el alcohol si el consumo del mismo es:   Hombres: Mayor a 14 tragos por semana o ms de 4 tragos por ocasin.  Mujeres: Mayor a 7 tragos por semana o ms de 3 tragos por ocasin. Tiene usted o su familia alguna historia clnica de problemas relacionados con el alcohol como:  Prdida del conocimiento.  Disfuncin sexual.  Depresin.  Traumatismos.  Enfermedades hepticas.  Trastornos del sueo.  Hipertensin arterial.  Dolor crnico abdominal.  Alguna vez el beber le ha ocasionado problemas, como por ejemplo con su familia, en su rendimiento laboral (o escolar),  accidentes o lesiones?  Ha tenido una compulsin a beber o se ha preocupado mientras lo haca?  Posee poco control o es incapaz de parar de beber una vez que ha comenzado?  Ha tenido que beber para evitar sntomas de abstinencia?  Ha tenido problemas con la abstinencia como temblores, nuseas, sudor o cambios en el humor?  Necesita ms alcohol que antes para emborracharse?  Siente una fuerte necesidad de beber?  Cambia de planes para poder beber?  Alguna vez ha bebido en la maana para aliviar temblores o resaca? Si ha respondido a Jerseyalguna de estas preguntas de Emerson Electricmanera positiva, puede que sea el momento de hablar con un profesional, familiar o amigos y ver si ellos creen que tiene un problema. El alcoholismo es una dependencia qumica que puede Theme park managerempeorar y Environmental health practitionerllegar a destruir su salud y Teacher, English as a foreign languagesus relaciones. Muchos alcohlicos mueren, se empobrecen o terminan en prisin. Esto es a menudo el resultado de una dependencia qumica.  No se desaliente si no est listo para actuar inmediatamente.  Las decisiones para cambiar su comportamiento a menudo implican altas y bajas entre el deseo de Saint Barthelemycambiar y la sensacin de que no puede decidirse.  Intente pensar ms seriamente sobre su comportamiento frente a la bebida.  Piense en razones para dejarla. PARA OBTENER INFORMACIN ADICIONAL, CONCURRIR A:  The  [employer] on Alcohol Abuse and Alcoholism (NIAAA) BasicStudents.dk   [employer] on Alcoholism and Drug Dependence (NCADD) www.ncadd.org  American Society of Addiction Medicine (ASAM) RoyalDiary.gl  Document Released: 04/14/2007 Document Revised: 03/30/2011 [facility] Patient Information 2015 [place], [place]. This information is not intended to replace advice given to you by your health care provider. Make sure you discuss any questions you have with your health care provider.

## 2013-07-18 NOTE — ED Provider Notes (Signed)
Medical screening examination/treatment/procedure(s) were performed by non-physician practitioner and as supervising physician I was immediately available for consultation/collaboration.   EKG Interpretation None       Kassim Guertin M Gustaf Mccarter, MD 07/18/13 0635 

## 2013-07-19 NOTE — ED Provider Notes (Signed)
Medical screening examination/treatment/procedure(s) were performed by non-physician practitioner and as supervising physician I was immediately available for consultation/collaboration.   EKG Interpretation None        Junius ArgyleForrest S Harrison, MD 07/19/13 1304

## 2013-07-20 ENCOUNTER — Encounter (HOSPITAL_COMMUNITY): Payer: Self-pay | Admitting: Emergency Medicine

## 2013-07-20 ENCOUNTER — Emergency Department (HOSPITAL_COMMUNITY)
Admission: EM | Admit: 2013-07-20 | Discharge: 2013-07-20 | Disposition: A | Discharge status: Home / Self Care | Payer: Self-pay | Attending: Emergency Medicine | Admitting: Emergency Medicine

## 2013-07-20 DIAGNOSIS — F10229 Alcohol dependence with intoxication, unspecified: Secondary | ICD-10-CM | POA: Insufficient documentation

## 2013-07-20 DIAGNOSIS — Z5900 Homelessness unspecified: Secondary | ICD-10-CM | POA: Insufficient documentation

## 2013-07-20 DIAGNOSIS — IMO0001 Reserved for inherently not codable concepts without codable children: Secondary | ICD-10-CM | POA: Insufficient documentation

## 2013-07-20 DIAGNOSIS — Z792 Long term (current) use of antibiotics: Secondary | ICD-10-CM | POA: Insufficient documentation

## 2013-07-20 DIAGNOSIS — Z79899 Other long term (current) drug therapy: Secondary | ICD-10-CM | POA: Insufficient documentation

## 2013-07-20 DIAGNOSIS — F10920 Alcohol use, unspecified with intoxication, uncomplicated: Secondary | ICD-10-CM
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
## 2013-07-20 NOTE — ED Provider Notes (Signed)
Medical screening examination/treatment/procedure(s) were performed by non-physician practitioner and as supervising physician I was immediately available for consultation/collaboration.   EKG Interpretation None       Julienne Vogler, MD 07/20/13 2312 

## 2013-07-20 NOTE — ED Notes (Signed)
PA has seen patient, states patient needs to sober up. Will con't to monitor.

## 2013-07-20 NOTE — ED Notes (Addendum)
Pt ambulated in the hall without difficulty. PA aware pt able to ambulate.

## 2013-07-20 NOTE — ED Notes (Signed)
Pt eating breakfast at present time. Will ambulate post meal.

## 2013-07-20 NOTE — ED Notes (Signed)
Bed: [facility] Expected date:  Expected time:  Means of arrival:  Comments: 

## 2013-07-20 NOTE — ED Provider Notes (Signed)
6:43 AM Patient signed out to me at change of shift by Rhea BleacherJosh Geiple, PA-C.  Patient is intoxicated, needs to become clinically sober and pass PO challenge, then d/c home.   7:19 AM Pt sleeping on stretcher.  States he is okay, ready to go home.   Will do PO challenge and ambulate.   I watched patient walk to the bathroom and back.  He has been given breakfast and states he has eaten some of it.  He is able to hold a conversation.  D/C home.    Trixie Dredgemily West, PA-C 07/20/13 1237  Medical screening examination/treatment/procedure(s) were performed by non-physician practitioner and as supervising physician I was immediately available for consultation/collaboration.   EKG Interpretation None       Derwood KaplanAnkit Kalliopi Coupland, MD 07/20/13 2312

## 2013-07-20 NOTE — Progress Notes (Signed)
[facility] provided pt with a list of primary care resources, highlighting [facility].  °
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
## 2013-10-07 ENCOUNTER — Emergency Department (HOSPITAL_COMMUNITY)
Admission: EM | Admit: 2013-10-07 | Discharge: 2013-10-08 | Discharge status: Against Medical Advice | Payer: Self-pay | Attending: Emergency Medicine | Admitting: Emergency Medicine

## 2013-10-07 ENCOUNTER — Encounter (HOSPITAL_COMMUNITY): Payer: Self-pay | Admitting: Emergency Medicine

## 2013-10-07 DIAGNOSIS — Z5900 Homelessness unspecified: Secondary | ICD-10-CM | POA: Insufficient documentation

## 2013-10-07 DIAGNOSIS — F10920 Alcohol use, unspecified with intoxication, uncomplicated: Secondary | ICD-10-CM

## 2013-10-07 DIAGNOSIS — F1010 Alcohol abuse, uncomplicated: Secondary | ICD-10-CM | POA: Insufficient documentation

## 2013-10-07 MED ORDER — ONDANSETRON 8 MG PO TBDP
8.0000 mg | ORAL_TABLET | Freq: Once | ORAL | Status: AC
  Administered 2013-10-07: 8 mg via ORAL
  Filled 2013-10-07: qty 1
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
## 2013-10-07 NOTE — ED Notes (Signed)
Bed: [facility] Expected date:  Expected time:  Means of arrival:  Comments: EMS 71M intoxication

## 2013-10-08 NOTE — ED Notes (Signed)
Pt. Left AMA. No s/s of distress. Ambulatory with steady gait. Alert and oriented x3.

## 2014-03-28 ENCOUNTER — Encounter (HOSPITAL_COMMUNITY): Payer: Self-pay | Admitting: Emergency Medicine

## 2014-05-13 ENCOUNTER — Emergency Department (HOSPITAL_COMMUNITY)
Admission: EM | Admit: 2014-05-13 | Discharge: 2014-05-14 | Disposition: A | Discharge status: Home / Self Care | Payer: Self-pay | Attending: Emergency Medicine | Admitting: Emergency Medicine

## 2014-05-13 DIAGNOSIS — K029 Dental caries, unspecified: Secondary | ICD-10-CM | POA: Insufficient documentation

## 2014-05-13 DIAGNOSIS — F10920 Alcohol use, unspecified with intoxication, uncomplicated: Secondary | ICD-10-CM

## 2014-05-13 DIAGNOSIS — R55 Syncope and collapse: Secondary | ICD-10-CM | POA: Insufficient documentation

## 2014-05-13 DIAGNOSIS — R4020 Unspecified coma: Secondary | ICD-10-CM

## 2014-05-13 DIAGNOSIS — Y929 Unspecified place or not applicable: Secondary | ICD-10-CM | POA: Insufficient documentation

## 2014-05-13 DIAGNOSIS — S0081XA Abrasion of other part of head, initial encounter: Secondary | ICD-10-CM | POA: Insufficient documentation

## 2014-05-13 DIAGNOSIS — Y999 Unspecified external cause status: Secondary | ICD-10-CM | POA: Insufficient documentation

## 2014-05-13 DIAGNOSIS — F10220 Alcohol dependence with intoxication, uncomplicated: Secondary | ICD-10-CM | POA: Insufficient documentation

## 2014-05-13 DIAGNOSIS — F1020 Alcohol dependence, uncomplicated: Secondary | ICD-10-CM

## 2014-05-13 DIAGNOSIS — Y939 Activity, unspecified: Secondary | ICD-10-CM | POA: Insufficient documentation

## 2014-05-13 DIAGNOSIS — S0993XA Unspecified injury of face, initial encounter: Secondary | ICD-10-CM | POA: Insufficient documentation

## 2014-05-13 DIAGNOSIS — Z590 Homelessness: Secondary | ICD-10-CM | POA: Insufficient documentation
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
## 2014-05-14 ENCOUNTER — Emergency Department (HOSPITAL_COMMUNITY): Payer: Self-pay

## 2014-05-14 ENCOUNTER — Encounter (HOSPITAL_COMMUNITY): Payer: Self-pay | Admitting: Emergency Medicine

## 2014-05-14 LAB — COMPREHENSIVE METABOLIC PANEL
ALBUMIN: 4.2 g/dL (ref 3.5–5.2)
ALK PHOS: 79 U/L (ref 39–117)
ALT: 35 U/L (ref 0–53)
ANION GAP: 10 (ref 5–15)
AST: 66 U/L — ABNORMAL HIGH (ref 0–37)
BILIRUBIN TOTAL: 0.6 mg/dL (ref 0.3–1.2)
BUN: 13 mg/dL (ref 6–23)
CALCIUM: 8.1 mg/dL — AB (ref 8.4–10.5)
CO2: 26 mmol/L (ref 19–32)
Chloride: 107 mmol/L (ref 96–112)
Creatinine, Ser: 0.8 mg/dL (ref 0.50–1.35)
GFR calc Af Amer: >90 mL/min (ref >90)
GFR calc non Af Amer: >90 mL/min (ref >90)
Glucose, Bld: 97 mg/dL (ref 70–99)
Potassium: 3.5 mmol/L (ref 3.5–5.1)
Sodium: 143 mmol/L (ref 135–145)
TOTAL PROTEIN: 7 g/dL (ref 6.0–8.3)

## 2014-05-14 LAB — CBC WITH DIFFERENTIAL/PLATELET
BASOS ABS: 0 10*3/uL (ref 0.0–0.1)
Basophils Relative: 0 % (ref 0–1)
EOS PCT: 2 % (ref 0–5)
Eosinophils Absolute: 0.1 10*3/uL (ref 0.0–0.7)
HCT: 36.5 % — ABNORMAL LOW (ref 39.0–52.0)
Hemoglobin: 13 g/dL (ref 13.0–17.0)
Lymphocytes Relative: 35 % (ref 12–46)
Lymphs Abs: 1.2 10*3/uL (ref 0.7–4.0)
MCH: 33.5 pg (ref 26.0–34.0)
MCHC: 35.6 g/dL (ref 30.0–36.0)
MCV: 94.1 fL (ref 78.0–100.0)
MONOS PCT: 6 % (ref 3–12)
Monocytes Absolute: 0.2 10*3/uL (ref 0.1–1.0)
Neutro Abs: 2 10*3/uL (ref 1.7–7.7)
Neutrophils Relative %: 57 % (ref 43–77)
Platelets: 149 10*3/uL — ABNORMAL LOW (ref 150–400)
RBC: 3.88 MIL/uL — ABNORMAL LOW (ref 4.22–5.81)
RDW: 13.1 % (ref 11.5–15.5)
WBC: 3.4 10*3/uL — ABNORMAL LOW (ref 4.0–10.5)

## 2014-05-14 LAB — ETHANOL: ALCOHOL ETHYL (B): 320 mg/dL — AB (ref 0–9)
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
## 2014-05-14 NOTE — ED Notes (Signed)
Pt able to ambulate on his own; walked the length of hallway
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
## 2015-02-12 ENCOUNTER — Encounter (HOSPITAL_COMMUNITY): Payer: Self-pay | Admitting: Emergency Medicine

## 2015-02-12 ENCOUNTER — Emergency Department (HOSPITAL_COMMUNITY)
Admission: EM | Admit: 2015-02-12 | Discharge: 2015-02-12 | Disposition: A | Discharge status: Home / Self Care | Payer: Self-pay | Attending: Emergency Medicine | Admitting: Emergency Medicine

## 2015-02-12 DIAGNOSIS — R109 Unspecified abdominal pain: Secondary | ICD-10-CM | POA: Insufficient documentation

## 2015-02-12 DIAGNOSIS — R51 Headache: Secondary | ICD-10-CM | POA: Insufficient documentation

## 2015-02-12 DIAGNOSIS — F10220 Alcohol dependence with intoxication, uncomplicated: Secondary | ICD-10-CM | POA: Insufficient documentation

## 2015-02-12 DIAGNOSIS — Z590 Homelessness: Secondary | ICD-10-CM | POA: Insufficient documentation

## 2015-02-12 LAB — ETHANOL: Alcohol, Ethyl (B): 260 mg/dL — ABNORMAL HIGH (ref <5)

## 2015-02-12 MED ORDER — VITAMIN B-1 100 MG PO TABS: 100.0000 mg | ORAL_TABLET | Freq: Once | ORAL | Status: DC

## 2015-02-12 NOTE — ED Provider Notes (Signed)
CSN: [id number]     Arrival date & time 02/12/15  0225 History   First MD Initiated Contact with Patient 02/12/15 0501     Chief Complaint  Patient presents with  . Alcohol Intoxication     (Consider location/radiation/quality/duration/timing/severity/associated sxs/prior Treatment) HPI  Level 5 Caveat: alcohol intoxication. [name] with a history of alcoholism and homelessness. He was found in a laundromat very intoxicated. He admits to "many many" when asked how much he has had to drink. He is complaining of nonspecific abdominal pain and headache but is too intoxicated to provide a detailed history. EMS reports no evidence of injury.  Past Medical History  Diagnosis Date  . Alcohol abuse   . Homeless    History reviewed. No pertinent past surgical history. No family history on file. Social History  Substance Use Topics  . Smoking status: Never Smoker   . Smokeless tobacco: None  . Alcohol Use: Yes     Comment: six 24 oz cans of beer daily    Review of Systems  Unable to perform ROS: Mental status change    Allergies  Review of patient's allergies indicates no known allergies.  Home Medications   Prior to Admission medications   Not on File   BP 139/100 mmHg  Pulse 88  Temp(Src) 98.2 F (36.8 C) (Oral)  Resp 18  SpO2 97%   Physical Exam  General: Well-developed, well-nourished male in no acute distress; appearance consistent with age of record HENT: normocephalic; atraumatic; breath smells of alcohol Eyes: Normal appearance Neck: supple Heart: regular rate and rhythm Lungs: clear to auscultation bilaterally Abdomen: soft; nondistended; mild upper abdominal tenderness; no masses or hepatosplenomegaly; bowel sounds present Extremities: No deformity; full range of motion; pulses normal Neurologic: Somnolent but arousable; noted to move all extremities; dysarthric; ataxic  Skin: Warm and dry   ED Course  Procedures (including critical  care time)   MDM  Nursing notes and vitals signs, including pulse oximetry, reviewed.  Summary of this visit's results, reviewed by myself:  Labs:  Results for orders placed or performed during the hospital encounter of 02/12/15 (from the past 24 hour(s))  Ethanol     Status: Abnormal   Collection Time: 02/12/15  5:18 AM  Result Value Ref Range   Alcohol, Ethyl (B) 260 (H) <5 mg/dL     Paula Libra, MD 78/29/56 [phone]

## 2015-02-12 NOTE — Discharge Instructions (Signed)
Recommend going to [facility] [employer]

## 2015-02-12 NOTE — ED Provider Notes (Signed)
Patient is alert, ambulatory without neurological deficits. Will discharge.  Donnetta Hutching, MD 02/12/15 [phone]

## 2015-02-12 NOTE — ED Notes (Signed)
Patient presents via EMS for ETOH. Patient found sleeping at [facility]. Denies c/c.

## 2016-02-04 ENCOUNTER — Encounter (HOSPITAL_COMMUNITY): Payer: Self-pay

## 2016-02-04 ENCOUNTER — Emergency Department (HOSPITAL_COMMUNITY)
Admission: EM | Admit: 2016-02-04 | Discharge: 2016-02-04 | Disposition: A | Discharge status: Home / Self Care | Payer: Self-pay | Attending: Emergency Medicine | Admitting: Emergency Medicine

## 2016-02-04 DIAGNOSIS — F1010 Alcohol abuse, uncomplicated: Secondary | ICD-10-CM | POA: Insufficient documentation

## 2016-02-04 NOTE — ED Notes (Signed)
Pt seen dressed and walking out of room.  Pt redirected back into room.  Pt, currently, sleeping.

## 2016-02-04 NOTE — ED Notes (Addendum)
Pt, now, reporting that he is suicidal and making a cutting motion w/ wrist.

## 2016-02-04 NOTE — ED Notes (Signed)
MD at bedside. 

## 2016-02-04 NOTE — ED Triage Notes (Signed)
[place].  [employer] reports the Pt has been drinking vodka.  It is believed that he has been there for 2-3 days.

## 2016-02-04 NOTE — ED Notes (Signed)
Bed: [facility]D Expected date:  Expected time:  Means of arrival:  Comments: 

## 2016-02-04 NOTE — ED Notes (Addendum)
Paperwork discussed with pt; pt verbalized understanding

## 2016-02-04 NOTE — ED Provider Notes (Signed)
Emergency Department Provider Note   I have reviewed the triage vital signs and the nursing notes.   HISTORY  Chief Complaint Alcohol Intoxication and Suicidal   HPI Jay [name] is a 51 y.o. male with PMH of homelessness and EtOH abuse presents to the emergency department for evaluation of alcohol intoxication. The patient was brought by EMS after being found outside intoxicated. Patient denies any SI/HI. No pain or injury. Denies fall.   Level 5 caveat: EtOH intoxication.    Past Medical History:  Diagnosis Date  . Alcohol abuse   . Homeless     Patient Active Problem List   Diagnosis Date Noted  . Alcohol dependence (HCC) 08/13/2011    History reviewed. No pertinent surgical history.    Allergies Patient has no known allergies.  History reviewed. No pertinent family history.  Social History Social History  Substance Use Topics  . Smoking status: Never Smoker  . Smokeless tobacco: Never Used  . Alcohol use Yes     Comment: six 24 oz cans of beer daily    Review of Systems  10-point ROS otherwise negative.  ____________________________________________   PHYSICAL EXAM:  VITAL SIGNS: ED Triage Vitals [02/04/16 1705]  Enc Vitals Group     BP 131/88     Pulse Rate 98     Resp 16     Temp 98.2 F (36.8 C)     Temp Source Oral     SpO2 95 %   Constitutional: Alert but appears intoxicated. Standing at bedside.  Eyes: Conjunctivae are normal. PERRL.  Head: Atraumatic. Nose: No congestion/rhinnorhea. Mouth/Throat: Mucous membranes are dry. Neck: No stridor. No cervical spine tenderness to palpation. Cardiovascular: Normal rate, regular rhythm. Good peripheral circulation. Grossly normal heart sounds.   Respiratory: Normal respiratory effort.  No retractions. Lungs CTAB. Gastrointestinal: Soft and nontender. No distention.  Musculoskeletal: No lower extremity tenderness nor edema. No gross deformities of extremities. Neurologic:  Slurred  speech. No gross focal neurologic deficits are appreciated.  Skin:  Skin is warm, dry and intact. No rash noted.  ____________________________________________   PROCEDURES  Procedure(s) performed:   Procedures  None ____________________________________________   INITIAL IMPRESSION / ASSESSMENT AND PLAN / ED COURSE  Pertinent labs & imaging results that were available during my care of the patient were reviewed by me and considered in my medical decision making (see chart for details).  Patient presents to the ED with EtOH intoxication by EMS. No concern for SI/HI. Spoke with [employer] he can take the patient to sober [facility] the jail and give something to eat. The patient is ambulatory. No evidence of head trauma or other underlying medical reason for his apparent intoxication.   At this time, I do not feel there is any life-threatening condition present. I have reviewed and discussed all results (EKG, imaging, lab, urine as appropriate), exam findings with patient. I have reviewed nursing notes and appropriate previous records.  I feel the patient is safe to be discharged home without further emergent workup. Discussed usual and customary return precautions. Patient and family (if present) verbalize understanding and are comfortable with this plan.  Patient will follow-up with their primary care provider. If they do not have a primary care provider, information for follow-up has been provided to them. All questions have been answered.  ____________________________________________  FINAL CLINICAL IMPRESSION(S) / ED DIAGNOSES  Final diagnoses:  Alcohol abuse     MEDICATIONS GIVEN DURING THIS VISIT:  None  NEW OUTPATIENT MEDICATIONS STARTED DURING THIS  VISIT:  None   Note:  This document was prepared using Dragon voice recognition software and may include unintentional dictation errors.  Alona BeneJoshua Long, MD Emergency Medicine   Maia PlanJoshua G Long, MD 02/05/16 1100

## 2016-02-04 NOTE — ED Notes (Signed)
Pt refused vital signs.

## 2016-05-02 ENCOUNTER — Emergency Department (HOSPITAL_COMMUNITY)
Admission: EM | Admit: 2016-05-02 | Discharge: 2016-05-03 | Disposition: A | Discharge status: Home / Self Care | Payer: Self-pay | Attending: Emergency Medicine | Admitting: Emergency Medicine

## 2016-05-02 ENCOUNTER — Emergency Department (HOSPITAL_COMMUNITY): Payer: Self-pay

## 2016-05-02 ENCOUNTER — Encounter (HOSPITAL_COMMUNITY): Payer: Self-pay | Admitting: *Deleted

## 2016-05-02 DIAGNOSIS — F10120 Alcohol abuse with intoxication, uncomplicated: Secondary | ICD-10-CM | POA: Insufficient documentation

## 2016-05-02 DIAGNOSIS — Y929 Unspecified place or not applicable: Secondary | ICD-10-CM | POA: Insufficient documentation

## 2016-05-02 DIAGNOSIS — Y999 Unspecified external cause status: Secondary | ICD-10-CM | POA: Insufficient documentation

## 2016-05-02 DIAGNOSIS — F10920 Alcohol use, unspecified with intoxication, uncomplicated: Secondary | ICD-10-CM

## 2016-05-02 DIAGNOSIS — W010XXA Fall on same level from slipping, tripping and stumbling without subsequent striking against object, initial encounter: Secondary | ICD-10-CM | POA: Insufficient documentation

## 2016-05-02 DIAGNOSIS — Y939 Activity, unspecified: Secondary | ICD-10-CM | POA: Insufficient documentation

## 2016-05-02 DIAGNOSIS — W19XXXA Unspecified fall, initial encounter: Secondary | ICD-10-CM

## 2016-05-02 LAB — RAPID URINE DRUG SCREEN, HOSP PERFORMED
Amphetamines: NOT DETECTED
BARBITURATES: NOT DETECTED
BENZODIAZEPINES: NOT DETECTED
COCAINE: NOT DETECTED
Opiates: NOT DETECTED
Tetrahydrocannabinol: NOT DETECTED

## 2016-05-02 LAB — CBC
HCT: 38.3 % — ABNORMAL LOW (ref 39.0–52.0)
HEMOGLOBIN: 13.9 g/dL (ref 13.0–17.0)
MCH: 34.1 pg — AB (ref 26.0–34.0)
MCHC: 36.3 g/dL — ABNORMAL HIGH (ref 30.0–36.0)
MCV: 93.9 fL (ref 78.0–100.0)
PLATELETS: 142 10*3/uL — AB (ref 150–400)
RBC: 4.08 MIL/uL — AB (ref 4.22–5.81)
RDW: 12.4 % (ref 11.5–15.5)
WBC: 4.1 10*3/uL (ref 4.0–10.5)

## 2016-05-02 LAB — COMPREHENSIVE METABOLIC PANEL
ALBUMIN: 4.6 g/dL (ref 3.5–5.0)
ALT: 46 U/L (ref 17–63)
ANION GAP: 15 (ref 5–15)
AST: 81 U/L — ABNORMAL HIGH (ref 15–41)
Alkaline Phosphatase: 75 U/L (ref 38–126)
BUN: 8 mg/dL (ref 6–20)
CHLORIDE: 102 mmol/L (ref 101–111)
CO2: 24 mmol/L (ref 22–32)
CREATININE: 1.07 mg/dL (ref 0.61–1.24)
Calcium: 8.7 mg/dL — ABNORMAL LOW (ref 8.9–10.3)
GFR calc Af Amer: >60 mL/min (ref >60)
Glucose, Bld: 135 mg/dL — ABNORMAL HIGH (ref 65–99)
Potassium: 3.3 mmol/L — ABNORMAL LOW (ref 3.5–5.1)
SODIUM: 141 mmol/L (ref 135–145)
Total Bilirubin: 0.5 mg/dL (ref 0.3–1.2)
Total Protein: 7.8 g/dL (ref 6.5–8.1)

## 2016-05-02 LAB — ETHANOL: Alcohol, Ethyl (B): 466 mg/dL (ref <5)

## 2016-05-02 NOTE — ED Triage Notes (Signed)
Pt arrives via [facility]. Bystanders reported seeing pt stumbling in the road and he fell into a bush. Pt is intoxicated, opens eyes to stimuli.

## 2016-05-02 NOTE — ED Notes (Signed)
Assisted pt to use urinal  

## 2016-05-02 NOTE — ED Notes (Signed)
ED Provider at bedside. 

## 2016-05-02 NOTE — ED Provider Notes (Signed)
  [facility]-EMERGENCY DEPT Provider Note   CSN: [id number] Arrival date & time: 05/02/16  2110     History   Chief Complaint Chief Complaint  Patient presents with  . Fall    HPI Jay [name] is a 51 y.o. male.   Altered Mental Status   This is a new problem. The problem has not changed since onset.Associated symptoms include somnolence. Risk factors include alcohol intake.    Past Medical History:  Diagnosis Date  . Alcohol abuse   . Homeless     Patient Active Problem List   Diagnosis Date Noted  . Alcohol dependence (HCC) 08/13/2011    History reviewed. No pertinent surgical history.     Home Medications    Prior to Admission medications   Not on File    Family History No family history on file.  Social History Social History  Substance Use Topics  . Smoking status: Never Smoker  . Smokeless tobacco: Never Used  . Alcohol use Yes     Comment: six 24 oz cans of beer daily     Allergies   Patient has no known allergies.   Review of Systems Review of Systems  Unable to perform ROS: Mental status change     Physical Exam Updated Vital Signs BP (!) 156/103   Pulse 99   Temp 97.9 F (36.6 C)   Resp 12   Wt 145 lb (65.8 kg)   SpO2 97%   BMI 25.69 kg/m   Physical Exam  Constitutional: He appears well-developed and well-nourished.  HENT:  Head: Normocephalic and atraumatic.  Right Ear: External ear normal.  Left Ear: External ear normal.  Eyes: Conjunctivae and EOM are normal. Pupils are equal, round, and reactive to light.  Neck: Normal range of motion.  Cardiovascular: Normal rate and regular rhythm.   Pulmonary/Chest: Effort normal. He has no wheezes.  Abdominal: Soft.  Musculoskeletal: Normal range of motion. He exhibits no edema, tenderness or deformity.  Neurological:  Sleepy, answers question with painful stimuli beforehand  Skin: Skin is warm and dry.  Nursing note and vitals reviewed.    ED Treatments / Results   Labs (all labs ordered are listed, but only abnormal results are displayed) Labs Reviewed  COMPREHENSIVE METABOLIC PANEL  CBC  ETHANOL    EKG  EKG Interpretation None       Radiology No results found.  Procedures Procedures (including critical care time)  Medications Ordered in ED Medications - No data to display   Initial Impression / Assessment and Plan / ED Course  I have reviewed the triage vital signs and the nursing notes.  Pertinent labs & imaging results that were available during my care of the patient were reviewed by me and considered in my medical decision making (see chart for details).     Likely intoxicated. Workup otherwise negative. Needs to metabolize alcohol and reassess for disposition.   Final Clinical Impressions(s) / ED Diagnoses   Final diagnoses:  None     Marily Memos, MD 05/03/16 [phone]

## 2016-05-02 NOTE — ED Notes (Signed)
Patient transported to CT 

## 2016-05-02 NOTE — ED Notes (Signed)
Bed: GN56 Expected date:  Expected time:  Means of arrival:  Comments: 41m fall laying in bushes laceration to face.

## 2016-05-03 NOTE — ED Provider Notes (Signed)
Intoxicated patient who is metabolizing to [facility]. Workup was negative including CT head for traumatic injuries.  Patient has since metabolize to [facility] and has ambulated without complication. Safe for discharge with strict return precautions.   Nira Conn, MD 05/03/16 1019

## 2016-05-03 NOTE — ED Notes (Signed)
Pt states he can't walk

## 2016-05-03 NOTE — ED Notes (Signed)
Walked pt provider wants let pt sleep a little longer then walk and d/c

## 2016-07-29 ENCOUNTER — Encounter (HOSPITAL_COMMUNITY): Payer: Self-pay | Admitting: Emergency Medicine

## 2016-07-29 ENCOUNTER — Emergency Department (HOSPITAL_COMMUNITY)
Admission: EM | Admit: 2016-07-29 | Discharge: 2016-07-30 | Disposition: A | Discharge status: Home / Self Care | Payer: Self-pay | Attending: Emergency Medicine | Admitting: Emergency Medicine

## 2016-07-29 ENCOUNTER — Emergency Department (HOSPITAL_COMMUNITY): Payer: Self-pay

## 2016-07-29 DIAGNOSIS — R1084 Generalized abdominal pain: Secondary | ICD-10-CM | POA: Insufficient documentation

## 2016-07-29 DIAGNOSIS — M549 Dorsalgia, unspecified: Secondary | ICD-10-CM

## 2016-07-29 DIAGNOSIS — R531 Weakness: Secondary | ICD-10-CM | POA: Insufficient documentation

## 2016-07-29 DIAGNOSIS — F10920 Alcohol use, unspecified with intoxication, uncomplicated: Secondary | ICD-10-CM | POA: Insufficient documentation

## 2016-07-29 DIAGNOSIS — M545 Low back pain: Secondary | ICD-10-CM | POA: Insufficient documentation

## 2016-07-29 LAB — CBC WITH DIFFERENTIAL/PLATELET
Basophils Absolute: 0 10*3/uL (ref 0.0–0.1)
Basophils Relative: 0 %
EOS ABS: 0 10*3/uL (ref 0.0–0.7)
Eosinophils Relative: 1 %
HCT: 38.1 % — ABNORMAL LOW (ref 39.0–52.0)
Hemoglobin: 13.1 g/dL (ref 13.0–17.0)
LYMPHS ABS: 1.4 10*3/uL (ref 0.7–4.0)
LYMPHS PCT: 40 %
MCH: 33.3 pg (ref 26.0–34.0)
MCHC: 34.4 g/dL (ref 30.0–36.0)
MCV: 96.9 fL (ref 78.0–100.0)
MONO ABS: 0.2 10*3/uL (ref 0.1–1.0)
Monocytes Relative: 6 %
Neutro Abs: 1.8 10*3/uL (ref 1.7–7.7)
Neutrophils Relative %: 53 %
PLATELETS: 137 10*3/uL — AB (ref 150–400)
RBC: 3.93 MIL/uL — AB (ref 4.22–5.81)
RDW: 13.1 % (ref 11.5–15.5)
WBC: 3.4 10*3/uL — AB (ref 4.0–10.5)

## 2016-07-29 LAB — I-STAT TROPONIN, ED: Troponin i, poc: 0 ng/mL (ref 0.00–0.08)

## 2016-07-29 MED ORDER — SODIUM CHLORIDE 0.9 % IV BOLUS (SEPSIS)
1000.0000 mL | Freq: Once | INTRAVENOUS | Status: AC
  Administered 2016-07-29: 1000 mL via INTRAVENOUS

## 2016-07-29 MED ORDER — IOPAMIDOL (ISOVUE-300) INJECTION 61%
INTRAVENOUS | Status: AC
  Administered 2016-07-30: 100 mL
  Filled 2016-07-29: qty 100

## 2016-07-29 MED ORDER — IBUPROFEN 200 MG PO TABS
600.0000 mg | ORAL_TABLET | Freq: Once | ORAL | Status: AC
  Administered 2016-07-29: 600 mg via ORAL
  Filled 2016-07-29: qty 1

## 2016-07-29 NOTE — ED Triage Notes (Signed)
Per EMS pt is homeless walked up to [employer] and said he was having CP called 911, limited english, refused aspirin and nitro, pain all over body and back, pt also is chronic alcohol abuse, elevated L bundle branch block

## 2016-07-29 NOTE — ED Provider Notes (Signed)
[facility]-EMERGENCY DEPT Provider Note   CSN: [id number] Arrival date & time: 07/29/16  2258   By signing my name below, I, Freida Busmaniana Omoyeni, attest that this documentation has been prepared under the direction and in the presence of Pricilla LovelessGoldston, Korbyn Chopin, MD . Electronically Signed: Freida Busmaniana Omoyeni, Scribe. 07/29/2016. 11:31 PM.  History   Chief Complaint Chief Complaint  Patient presents with  . Chest Pain   LEVEL 5 CAVEAT- ALCOHOL INTOXICATION  The history is provided by the patient. A language interpreter was used (Spanish; interpretor [phone]).     HPI Comments:  Jay [name] is a 51 y.o. male with a history of alcohol abuse, who presents to the Emergency Department complaining of worsening mid back pain x 1 month.  He states he was bitten by a spider in his back, no other injuries. Pt states he needs IV fluids as he has had generalized weakness and myalgias for 3 weeks. No alleviating factors noted; no treatments tried PTA. Pt also notes mild SOB x 1 week, subjective fever, and decreased appetite due to ETOH consumption. Pt admits to drinking 5 beers today.   Past Medical History:  Diagnosis Date  . Alcohol abuse   . Homeless     Patient Active Problem List   Diagnosis Date Noted  . Alcohol dependence (HCC) 08/13/2011    History reviewed. No pertinent surgical history.     Home Medications    Prior to Admission medications   Not on File    Family History No family history on file.  Social History Social History  Substance Use Topics  . Smoking status: Never Smoker  . Smokeless tobacco: Never Used  . Alcohol use Yes     Comment: six 24 oz cans of beer daily     Allergies   Patient has no known allergies.   Review of Systems Review of Systems  Constitutional: Positive for fever (subjective ).  Respiratory: Positive for shortness of breath. Negative for cough.   Musculoskeletal: Positive for back pain and myalgias.  Neurological: Positive for weakness  (generalized).  All other systems reviewed and are negative.    Physical Exam Updated Vital Signs BP (!) 149/95   Pulse 62   Temp 98.4 F (36.9 C) (Oral)   Resp 16   SpO2 98%   Physical Exam  Constitutional: He is oriented to person, place, and time. He appears well-developed and well-nourished.  HENT:  Head: Normocephalic and atraumatic.  Right Ear: External ear normal.  Left Ear: External ear normal.  Nose: Nose normal.  Eyes: Right eye exhibits no discharge. Left eye exhibits no discharge.  Neck: Neck supple.  Cardiovascular: Normal rate, regular rhythm and normal heart sounds.   Pulmonary/Chest: Effort normal and breath sounds normal.  Abdominal: Soft. There is tenderness (diffuse).  Musculoskeletal: He exhibits tenderness. He exhibits no edema.  diffuse thoracic and lumbar tenderness   Neurological: He is alert and oriented to person, place, and time.  Equal strength in all 4 extremities but poor effort  No facial droop   Skin: Skin is warm and dry.  Nursing note and vitals reviewed.    ED Treatments / Results  DIAGNOSTIC STUDIES:  Oxygen Saturation is 97% on RA, normal by my interpretation.    COORDINATION OF CARE:  11:21 PM Discussed treatment plan with pt at bedside and pt agreed to plan.  Labs (all labs ordered are listed, but only abnormal results are displayed) Labs Reviewed  COMPREHENSIVE METABOLIC PANEL - Abnormal; Notable for the following:  Result Value   Potassium 3.2 (*)    BUN 5 (*)    Calcium 8.3 (*)    AST 110 (*)    All other components within normal limits  CBC WITH DIFFERENTIAL/PLATELET - Abnormal; Notable for the following:    WBC 3.4 (*)    RBC 3.93 (*)    HCT 38.1 (*)    Platelets 137 (*)    All other components within normal limits  ETHANOL - Abnormal; Notable for the following:    Alcohol, Ethyl (B) 454 (*)    All other components within normal limits  URINALYSIS, ROUTINE W REFLEX MICROSCOPIC - Abnormal; Notable for the  following:    Color, Urine STRAW (*)    Specific Gravity, Urine 1.004 (*)    Hgb urine dipstick MODERATE (*)    All other components within normal limits  CK - Abnormal; Notable for the following:    Total CK 669 (*)    All other components within normal limits  LIPASE, BLOOD - Abnormal; Notable for the following:    Lipase 61 (*)    All other components within normal limits  RAPID URINE DRUG SCREEN, HOSP PERFORMED  BRAIN NATRIURETIC PEPTIDE  I-STAT TROPOININ, ED  I-STAT TROPOININ, ED    EKG  EKG Interpretation  Date/Time:  Wednesday July 29 2016 23:05:05 EDT Ventricular Rate:  83 PR Interval:    QRS Duration: 98 QT Interval:  381 QTC Calculation: 448 R Axis:   -82 Text Interpretation:  Sinus rhythm Left anterior fascicular block Probable anteroseptal infarct, recent V2 ST elevation similar to 2009 Confirmed by Pricilla Loveless [phone]) on 07/29/2016 11:32:58 PM       EKG Interpretation  Date/Time:  Thursday July 30 2016 03:37:37 EDT Ventricular Rate:  61 PR Interval:    QRS Duration: 105 QT Interval:  456 QTC Calculation: 460 R Axis:   -90 Text Interpretation:  Sinus rhythm Incomplete RBBB and LAFB Probable anteroseptal infarct, old no significant change compared to yesterday night Confirmed by Pricilla Loveless [phone]) on 07/30/2016 4:03:49 AM        Radiology Dg Chest 2 View  Result Date: 07/30/2016 CLINICAL DATA:  51 year old male with chest pain. EXAM: CHEST  2 VIEW and thoracic spine radiographs three views. COMPARISON:  Chest radiograph dated 08/22/2007 FINDINGS: There is mild cardiomegaly with mild prominence of the central vasculature. No focal consolidation, pleural effusion, or pneumothorax. There is no acute fracture or subluxation of the thoracic spine. The vertebral body heights and disc spaces appear preserved. IMPRESSION: 1. Mild cardiomegaly with possible mild vascular congestion. No focal consolidation. 2. No acute/traumatic thoracic spine pathology.  Electronically Signed   By: Elgie Collard M.D.   On: 07/30/2016 00:05   Dg Thoracic Spine W/swimmers  Result Date: 07/30/2016 CLINICAL DATA:  51 year old male with chest pain. EXAM: CHEST  2 VIEW and thoracic spine radiographs three views. COMPARISON:  Chest radiograph dated 08/22/2007 FINDINGS: There is mild cardiomegaly with mild prominence of the central vasculature. No focal consolidation, pleural effusion, or pneumothorax. There is no acute fracture or subluxation of the thoracic spine. The vertebral body heights and disc spaces appear preserved. IMPRESSION: 1. Mild cardiomegaly with possible mild vascular congestion. No focal consolidation. 2. No acute/traumatic thoracic spine pathology. Electronically Signed   By: Elgie Collard M.D.   On: 07/30/2016 00:05   Ct Abdomen Pelvis W Contrast  Result Date: 07/30/2016 CLINICAL DATA:  51 year old male with history of alcohol abuse presenting with worsening mid back pain. EXAM: CT  ABDOMEN AND PELVIS WITH CONTRAST TECHNIQUE: Multidetector CT imaging of the abdomen and pelvis was performed using the standard protocol following bolus administration of intravenous contrast. CONTRAST:  ISOVUE-300 IOPAMIDOL (ISOVUE-300) INJECTION 61% COMPARISON:  Abdominal ultrasound dated 09/01/2007 FINDINGS: Lower chest: Minimal bibasilar dependent atelectatic changes. The visualized lung bases are otherwise clear. No intra-abdominal free air or free fluid. Hepatobiliary: Diffuse fatty infiltration of the liver. Multiple hepatic hypodense lesions noted measuring up to 3.5 cm in the right lobe of the liver. The largest lesion demonstrates fluid attenuation and represents cysts. The smaller lesions are too small to characterize but also likely represent cysts. There is no intrahepatic biliary ductal dilatation. The gallbladder is physiologically distended. Minimal amount of layering small stones noted in the gallbladder neck. No pericholecystic fluid. Pancreas:  Unremarkable. No pancreatic ductal dilatation or surrounding inflammatory changes. Spleen: Normal in size without focal abnormality. Adrenals/Urinary Tract: There are innumerable low attenuating and cystic lesions throughout the kidneys replacing a significant portion of the renal parenchyma. Some of these lesions demonstrate high attenuation than water and likely represent complex cysts. Further characterization with MRI is recommended. Small nonobstructing right renal calculi measure 3-4 mm. There is no hydronephrosis on either side. There is symmetric and homogeneous enhancement and excretion of contrast by kidneys. The visualized ureters and urinary bladder appear unremarkable. Stomach/Bowel: Nondistended mildly fecalized loops of small bowel noted which may represent increased transit time or related to small intestinal bacterial overgrowth (SIBO). There is no evidence of bowel obstruction or active inflammation. The appendix is normal. Vascular/Lymphatic: No significant vascular findings are present. No enlarged abdominal or pelvic lymph nodes. Reproductive: The prostate and seminal vesicles are grossly unremarkable. Other: Small fat containing umbilical hernia. Musculoskeletal: No acute or significant osseous findings. IMPRESSION: 1. No acute intra-abdominopelvic pathology. Specifically no CT evidence of acute pancreatitis. Correlation with pancreatic enzymes recommended. 2. Fatty liver. 3. Innumerable bilateral renal and hepatic cysts consistent with polycystic kidney disease. Some of the renal cysts demonstrate slightly higher attenuation, likely complex cysts. Further characterization with MRI without and with contrast recommended. Tiny nonobstructing right renal calculi. No hydronephrosis. 4. Small gallstones.  No evidence of acute cholecystitis by CT. 5. No evidence of bowel obstruction or active inflammation. Normal appendix. Electronically Signed   By: Elgie Collard M.D.   On: 07/30/2016 00:38     Procedures Procedures (including critical care time)  Medications Ordered in ED Medications  sodium chloride 0.9 % bolus 1,000 mL (0 mLs Intravenous Stopped 07/30/16 0030)  ibuprofen (ADVIL,MOTRIN) tablet 600 mg (600 mg Oral Given 07/29/16 2357)  iopamidol (ISOVUE-300) 61 % injection (100 mLs  Contrast Given 07/30/16 0013)  potassium chloride SA (K-DUR,KLOR-CON) CR tablet 40 mEq (40 mEq Oral Given 07/30/16 0042)     Initial Impression / Assessment and Plan / ED Course  I have reviewed the triage vital signs and the nursing notes.  Pertinent labs & imaging results that were available during my care of the patient were reviewed by me and considered in my medical decision making (see chart for details).     Patient has multiple complaints. His primary complaint appears to be generalized weakness and him requesting IV fluids. Besides significant alcohol intoxication his labs are otherwise unremarkable besides mild rhabdomyolysis with CK under 700. Renal function is unremarkable and at baseline. Otherwise no focal weakness. Difficult to localize back pain but I highly doubt spinal emergency. He is able to ambulate without difficulty now that he has sobered. Chest pain is difficult to assess  but has 2 negative troponins and no acute ischemic changes. I doubt PE. Doubt dissection. At this point he appears medically stable for discharge with outpatient follow-up with a PCP. Discussed return precautions. Discussed all of this through the interpreter.  Final Clinical Impressions(s) / ED Diagnoses   Final diagnoses:  Back pain  Alcoholic intoxication without complication (HCC)  Generalized weakness    New Prescriptions New Prescriptions   No medications on file   I personally performed the services described in this documentation, which was scribed in my presence. The recorded information has been reviewed and is accurate.    Pricilla Loveless, MD 07/30/16 [phone]

## 2016-07-30 ENCOUNTER — Emergency Department (HOSPITAL_COMMUNITY): Payer: Self-pay

## 2016-07-30 ENCOUNTER — Encounter (HOSPITAL_COMMUNITY): Payer: Self-pay | Admitting: Radiology

## 2016-07-30 LAB — ETHANOL: ALCOHOL ETHYL (B): 454 mg/dL — AB (ref <5)

## 2016-07-30 LAB — LIPASE, BLOOD: Lipase: 61 U/L — ABNORMAL HIGH (ref 11–51)

## 2016-07-30 LAB — COMPREHENSIVE METABOLIC PANEL
ALBUMIN: 4.5 g/dL (ref 3.5–5.0)
ALT: 43 U/L (ref 17–63)
AST: 110 U/L — AB (ref 15–41)
Alkaline Phosphatase: 87 U/L (ref 38–126)
Anion gap: 12 (ref 5–15)
BILIRUBIN TOTAL: 0.5 mg/dL (ref 0.3–1.2)
BUN: 5 mg/dL — AB (ref 6–20)
CHLORIDE: 106 mmol/L (ref 101–111)
CO2: 23 mmol/L (ref 22–32)
CREATININE: 1.05 mg/dL (ref 0.61–1.24)
Calcium: 8.3 mg/dL — ABNORMAL LOW (ref 8.9–10.3)
GFR calc Af Amer: >60 mL/min (ref >60)
GLUCOSE: 98 mg/dL (ref 65–99)
Potassium: 3.2 mmol/L — ABNORMAL LOW (ref 3.5–5.1)
Sodium: 141 mmol/L (ref 135–145)
Total Protein: 7.2 g/dL (ref 6.5–8.1)

## 2016-07-30 LAB — I-STAT TROPONIN, ED: TROPONIN I, POC: 0.02 ng/mL (ref 0.00–0.08)

## 2016-07-30 LAB — RAPID URINE DRUG SCREEN, HOSP PERFORMED
Amphetamines: NOT DETECTED
BARBITURATES: NOT DETECTED
BENZODIAZEPINES: NOT DETECTED
COCAINE: NOT DETECTED
OPIATES: NOT DETECTED
TETRAHYDROCANNABINOL: NOT DETECTED

## 2016-07-30 LAB — URINALYSIS, ROUTINE W REFLEX MICROSCOPIC
BACTERIA UA: NONE SEEN
BILIRUBIN URINE: NEGATIVE
Glucose, UA: NEGATIVE mg/dL
Ketones, ur: NEGATIVE mg/dL
Leukocytes, UA: NEGATIVE
Nitrite: NEGATIVE
PH: 6 (ref 5.0–8.0)
Protein, ur: NEGATIVE mg/dL
SPECIFIC GRAVITY, URINE: 1.004 — AB (ref 1.005–1.030)
SQUAMOUS EPITHELIAL / LPF: NONE SEEN

## 2016-07-30 LAB — CK: Total CK: 669 U/L — ABNORMAL HIGH (ref 49–397)

## 2016-07-30 LAB — BRAIN NATRIURETIC PEPTIDE: B NATRIURETIC PEPTIDE 5: 9.9 pg/mL (ref 0.0–100.0)

## 2016-07-30 MED ORDER — SODIUM CHLORIDE 0.9 % IV BOLUS (SEPSIS): 1000.0000 mL | Freq: Once | INTRAVENOUS | Status: DC

## 2016-07-30 MED ORDER — POTASSIUM CHLORIDE CRYS ER 20 MEQ PO TBCR
40.0000 meq | EXTENDED_RELEASE_TABLET | Freq: Once | ORAL | Status: AC
  Administered 2016-07-30: 40 meq via ORAL
  Filled 2016-07-30: qty 2

## 2016-07-30 NOTE — ED Notes (Signed)
Patient transported to CT 

## 2016-08-05 ENCOUNTER — Emergency Department (HOSPITAL_COMMUNITY)
Admission: EM | Admit: 2016-08-05 | Discharge: 2016-08-05 | Disposition: A | Discharge status: Home / Self Care | Payer: Self-pay | Attending: Emergency Medicine | Admitting: Emergency Medicine

## 2016-08-05 ENCOUNTER — Encounter (HOSPITAL_COMMUNITY): Payer: Self-pay | Admitting: Emergency Medicine

## 2016-08-05 DIAGNOSIS — F10920 Alcohol use, unspecified with intoxication, uncomplicated: Secondary | ICD-10-CM | POA: Insufficient documentation

## 2016-08-05 DIAGNOSIS — Z5321 Procedure and treatment not carried out due to patient leaving prior to being seen by health care provider: Secondary | ICD-10-CM | POA: Insufficient documentation

## 2016-08-05 NOTE — ED Notes (Signed)
Pt woke up started yelling and left

## 2016-08-05 NOTE — ED Triage Notes (Signed)
Pt found at gas station, intoxicated.ETOH  Pt wants food and water and pain meds. V/s 150/110, hr 90, rr16, cbg116, stat 98 room air.  Pt screaming spanish.

## 2016-08-05 NOTE — ED Notes (Signed)
[facility] Expected date:  Expected time:  Means of arrival:  Comments: [age]yr old ETOH sleep it off

## 2016-08-05 NOTE — ED Notes (Signed)
Patient awake-loud and verbally abusive-waving arms at staff-patient wants to leave-escorted out by security

## 2017-08-02 ENCOUNTER — Emergency Department (HOSPITAL_COMMUNITY)
Admission: EM | Admit: 2017-08-02 | Discharge: 2017-08-03 | Disposition: A | Discharge status: Home / Self Care | Payer: Self-pay | Attending: Emergency Medicine | Admitting: Emergency Medicine

## 2017-08-02 DIAGNOSIS — F10920 Alcohol use, unspecified with intoxication, uncomplicated: Secondary | ICD-10-CM | POA: Insufficient documentation

## 2017-08-02 LAB — CBC
HEMATOCRIT: 37.8 % — AB (ref 39.0–52.0)
Hemoglobin: 13.1 g/dL (ref 13.0–17.0)
MCH: 33.2 pg (ref 26.0–34.0)
MCHC: 34.7 g/dL (ref 30.0–36.0)
MCV: 95.7 fL (ref 78.0–100.0)
PLATELETS: 187 10*3/uL (ref 150–400)
RBC: 3.95 MIL/uL — ABNORMAL LOW (ref 4.22–5.81)
RDW: 12.4 % (ref 11.5–15.5)
WBC: 3.7 10*3/uL — AB (ref 4.0–10.5)

## 2017-08-02 LAB — BASIC METABOLIC PANEL
Anion gap: 15 (ref 5–15)
BUN: 7 mg/dL (ref 6–20)
CHLORIDE: 103 mmol/L (ref 98–111)
CO2: 25 mmol/L (ref 22–32)
Calcium: 8.9 mg/dL (ref 8.9–10.3)
Creatinine, Ser: 1.1 mg/dL (ref 0.61–1.24)
GFR calc Af Amer: >60 mL/min (ref >60)
GFR calc non Af Amer: >60 mL/min (ref >60)
GLUCOSE: 96 mg/dL (ref 70–99)
POTASSIUM: 3.3 mmol/L — AB (ref 3.5–5.1)
Sodium: 143 mmol/L (ref 135–145)

## 2017-08-02 LAB — I-STAT TROPONIN, ED: TROPONIN I, POC: 0 ng/mL (ref 0.00–0.08)

## 2017-08-02 NOTE — ED Triage Notes (Signed)
Pt found at fast food place, closing, [facility] called, EMS called, "chest pain to knees" and EOTH.  Pt is unable to sit in wheelchair.  States he needs water.

## 2017-08-02 NOTE — ED Provider Notes (Signed)
[facility] EMERGENCY DEPARTMENT Provider Note   CSN: [id number] Arrival date & time: 08/02/17  2155     History   Chief Complaint Chief Complaint  Patient presents with  . Chest Pain  . Alcohol Intoxication    HPI Jay [name] is a 52 y.o. male.  Patient presents to the emergency department with a chief complaint of alcohol intoxication.  Of note, he is homeless.  He reports that he has been drinking heavily.  He states that he has pain from his chest to his knees.  He is a poor historian secondary to being intoxicated.  Level 5 caveat applies.  The history is provided by the patient. No language interpreter was used.    Past Medical History:  Diagnosis Date  . Alcohol abuse   . Homeless     Patient Active Problem List   Diagnosis Date Noted  . Alcohol dependence (HCC) 08/13/2011    No past surgical history on file.      Home Medications    Prior to Admission medications   Not on File    Family History No family history on file.  Social History Social History   Tobacco Use  . Smoking status: Never Smoker  . Smokeless tobacco: Never Used  Substance Use Topics  . Alcohol use: Yes    Comment: six 24 oz cans of beer daily  . Drug use: No     Allergies   Patient has no known allergies.   Review of Systems Review of Systems  All other systems reviewed and are negative.    Physical Exam Updated Vital Signs BP (!) 160/111 (BP Location: Left Arm)   Pulse 76   Temp 99.2 F (37.3 C) (Oral)   Resp 20   SpO2 100%   Physical Exam  Constitutional: He appears well-developed and well-nourished.  HENT:  Head: Normocephalic and atraumatic.  Eyes: Pupils are equal, round, and reactive to light. Conjunctivae and EOM are normal. Right eye exhibits no discharge. Left eye exhibits no discharge. No scleral icterus.  Neck: Normal range of motion. Neck supple. No JVD present.  Cardiovascular: Normal rate, regular rhythm and normal heart  sounds. Exam reveals no gallop and no friction rub.  No murmur heard. Pulmonary/Chest: Effort normal and breath sounds normal. No respiratory distress. He has no wheezes. He has no rales. He exhibits no tenderness.  Abdominal: Soft. He exhibits no distension and no mass. There is no tenderness. There is no rebound and no guarding.  Musculoskeletal: Normal range of motion. He exhibits no edema or tenderness.  Neurological: He is alert.  Skin: Skin is warm and dry.  Psychiatric: He has a normal mood and affect. His behavior is normal. Judgment and thought content normal.  Nursing note and vitals reviewed.    ED Treatments / Results  Labs (all labs ordered are listed, but only abnormal results are displayed) Labs Reviewed  CBC - Abnormal; Notable for the following components:      Result Value   WBC 3.7 (*)    RBC 3.95 (*)    HCT 37.8 (*)    All other components within normal limits  BASIC METABOLIC PANEL  ETHANOL  I-STAT TROPONIN, ED    EKG EKG Interpretation  Date/Time:  Monday August 02 2017 22:26:49 EDT Ventricular Rate:  61 PR Interval:    QRS Duration: 112 QT Interval:  449 QTC Calculation: 453 R Axis:   -67 Text Interpretation:  Sinus rhythm Incomplete RBBB and LAFB Anterior  infarct, acute (LAD) No significant change since last tracing Confirmed by Gwyneth SproutPlunkett, Whitney ([id number]) on 08/02/2017 10:30:08 PM   Radiology No results found.  Procedures Procedures (including critical care time)  Medications Ordered in ED Medications - No data to display   Initial Impression / Assessment and Plan / ED Course  I have reviewed the triage vital signs and the nursing notes.  Pertinent labs & imaging results that were available during my care of the patient were reviewed by me and considered in my medical decision making (see chart for details).     Patient is intoxicated.  Poor historian.  Moves all extremities.  No inserting physical exam findings.  Will reassess in the  morning.  6:30 AM Patient requesting to be discharged.  He states that he like something to eat and drink.  He is alert and oriented and clinically stable for discharge.  Final Clinical Impressions(s) / ED Diagnoses   Final diagnoses:  Alcoholic intoxication without complication [facility](HCC)    ED Discharge Orders    None       Roxy HorsemanBrowning, Abbey Veith, PA-C 08/03/17 0630    Wilkie AyeHorton, Mayer Maskerourtney F, MD 08/03/17 [phone]

## 2017-08-03 LAB — ETHANOL: Alcohol, Ethyl (B): 416 mg/dL (ref <10)

## 2017-08-03 NOTE — ED Notes (Signed)
Ambulated PT with mild unsteadiness with no assistance

## 2018-01-27 ENCOUNTER — Encounter (HOSPITAL_COMMUNITY): Payer: Self-pay

## 2018-01-27 ENCOUNTER — Other Ambulatory Visit: Payer: Self-pay

## 2018-01-27 ENCOUNTER — Emergency Department (HOSPITAL_COMMUNITY)
Admission: EM | Admit: 2018-01-27 | Discharge: 2018-01-27 | Disposition: A | Discharge status: Home / Self Care | Payer: Self-pay | Attending: Emergency Medicine | Admitting: Emergency Medicine

## 2018-01-27 DIAGNOSIS — F1010 Alcohol abuse, uncomplicated: Secondary | ICD-10-CM

## 2018-01-27 DIAGNOSIS — F10920 Alcohol use, unspecified with intoxication, uncomplicated: Secondary | ICD-10-CM | POA: Insufficient documentation

## 2018-01-27 NOTE — ED Provider Notes (Signed)
[facility]-EMERGENCY DEPT Provider Note   CSN: [id number] Arrival date & time: 01/27/18  0041     History   Chief Complaint Chief Complaint  Patient presents with  . Alcohol Intoxication    HPI Jay [name] is a 53 y.o. male.  The history is provided by the EMS personnel. The history is limited by the condition of the patient.  Alcohol Intoxication  This is a chronic problem. The current episode started more than 1 week ago. The problem occurs constantly. The problem has not changed since onset.Pertinent negatives include no chest pain, no abdominal pain, no headaches and no shortness of breath. Nothing aggravates the symptoms. Nothing relieves the symptoms. He has tried nothing for the symptoms. The treatment provided no relief.    Past Medical History:  Diagnosis Date  . Alcohol abuse   . Homeless     Patient Active Problem List   Diagnosis Date Noted  . Alcohol dependence (HCC) 08/13/2011    History reviewed. No pertinent surgical history.      Home Medications    Prior to Admission medications   Not on File    Family History No family history on file.  Social History Social History   Tobacco Use  . Smoking status: Never Smoker  . Smokeless tobacco: Never Used  Substance Use Topics  . Alcohol use: Yes    Comment: six 24 oz cans of beer daily  . Drug use: No     Allergies   Patient has no known allergies.   Review of Systems Review of Systems  Respiratory: Negative for shortness of breath.   Cardiovascular: Negative for chest pain.  Gastrointestinal: Negative for abdominal pain.  Neurological: Negative for headaches.  All other systems reviewed and are negative.    Physical Exam Updated Vital Signs SpO2 99%   Physical Exam Vitals signs and nursing note reviewed.  Constitutional:      Appearance: He is not ill-appearing.     Comments: Easily arouses to verbal stimuli but prefers to sleep  HENT:     Head:  Normocephalic and atraumatic.     Mouth/Throat:     Mouth: Mucous membranes are moist.  Eyes:     Extraocular Movements: Extraocular movements intact.  Neck:     Musculoskeletal: Normal range of motion and neck supple.  Cardiovascular:     Rate and Rhythm: Normal rate and regular rhythm.  Pulmonary:     Effort: Pulmonary effort is normal.     Breath sounds: Normal breath sounds.  Abdominal:     General: Abdomen is flat. Bowel sounds are normal.     Palpations: Abdomen is soft.     Tenderness: There is no abdominal tenderness.  Musculoskeletal: Normal range of motion.  Skin:    General: Skin is warm and dry.     Capillary Refill: Capillary refill takes less than 2 seconds.  Neurological:     General: No focal deficit present.  Psychiatric:        Mood and Affect: Mood normal.        Behavior: Behavior normal.      ED Treatments / Results  Labs (all labs ordered are listed, but only abnormal results are displayed) Labs Reviewed - No data to display  EKG None  Radiology No results found.  Procedures Procedures (including critical care time)  Medications Ordered in ED   Final Clinical Impressions(s) / ED Diagnoses  Signed out to am attending patient will need to walk and  PO challenge prior to discharge when he is sober.    Return for pain, intractable cough, productive cough, fevers >100.4 unrelieved by medication, shortness of breath, intractable vomiting, or diarrhea, abdominal pain, Inability to tolerate liquids or food, cough, altered mental status or any concerns. No signs of systemic illness or infection. The patient is nontoxic-appearing on exam and vital signs are within normal limits.   I have reviewed the triage vital signs and the nursing notes. Pertinent labs &imaging results that were available during my care of the patient were reviewed by me and considered in my medical decision making (see chart for details).  After history, exam, and medical  workup I feel the patient has been appropriately medically screened and is safe for discharge home. Pertinent diagnoses were discussed with the patient. Patient was given return precautions.   Daleyssa Loiselle, MD 01/27/18 6333

## 2018-01-27 NOTE — ED Notes (Signed)
[facility] Expected date:  Expected time:  Means of arrival:  Comments: 53 yr old ETOH from [employer]

## 2018-01-27 NOTE — ED Triage Notes (Signed)
Pt brought by [facility] due to alcohol intoxication. Per EMS, pt walked into [employer] and requested ambulance.   16 G Left AC 1 L fluid

## 2018-02-12 ENCOUNTER — Other Ambulatory Visit: Payer: Self-pay

## 2018-02-12 ENCOUNTER — Encounter (HOSPITAL_COMMUNITY): Payer: Self-pay | Admitting: Emergency Medicine

## 2018-02-12 ENCOUNTER — Emergency Department (HOSPITAL_COMMUNITY)
Admission: EM | Admit: 2018-02-12 | Discharge: 2018-02-12 | Disposition: A | Discharge status: Home / Self Care | Payer: Self-pay | Attending: Emergency Medicine | Admitting: Emergency Medicine

## 2018-02-12 DIAGNOSIS — T148XXA Other injury of unspecified body region, initial encounter: Secondary | ICD-10-CM

## 2018-02-12 DIAGNOSIS — F10229 Alcohol dependence with intoxication, unspecified: Secondary | ICD-10-CM | POA: Insufficient documentation

## 2018-02-12 DIAGNOSIS — M79602 Pain in left arm: Secondary | ICD-10-CM | POA: Insufficient documentation

## 2018-02-12 DIAGNOSIS — R200 Anesthesia of skin: Secondary | ICD-10-CM | POA: Insufficient documentation

## 2018-02-12 DIAGNOSIS — Z5321 Procedure and treatment not carried out due to patient leaving prior to being seen by health care provider: Secondary | ICD-10-CM | POA: Insufficient documentation

## 2018-02-12 DIAGNOSIS — F1029 Alcohol dependence with unspecified alcohol-induced disorder: Secondary | ICD-10-CM

## 2018-02-12 DIAGNOSIS — R202 Paresthesia of skin: Secondary | ICD-10-CM

## 2018-02-12 LAB — CBC WITH DIFFERENTIAL/PLATELET
Abs Immature Granulocytes: 0.01 10*3/uL (ref 0.00–0.07)
BASOS ABS: 0 10*3/uL (ref 0.0–0.1)
Basophils Relative: 0 %
EOS ABS: 0.1 10*3/uL (ref 0.0–0.5)
Eosinophils Relative: 2 %
HEMATOCRIT: 37 % — AB (ref 39.0–52.0)
Hemoglobin: 12.5 g/dL — ABNORMAL LOW (ref 13.0–17.0)
IMMATURE GRANULOCYTES: 0 %
LYMPHS ABS: 0.6 10*3/uL — AB (ref 0.7–4.0)
Lymphocytes Relative: 18 %
MCH: 33.3 pg (ref 26.0–34.0)
MCHC: 33.8 g/dL (ref 30.0–36.0)
MCV: 98.7 fL (ref 80.0–100.0)
Monocytes Absolute: 0.3 10*3/uL (ref 0.1–1.0)
Monocytes Relative: 10 %
NEUTROS PCT: 70 %
NRBC: 0 % (ref 0.0–0.2)
Neutro Abs: 2.2 10*3/uL (ref 1.7–7.7)
PLATELETS: 134 10*3/uL — AB (ref 150–400)
RBC: 3.75 MIL/uL — ABNORMAL LOW (ref 4.22–5.81)
RDW: 12.8 % (ref 11.5–15.5)
WBC: 3.1 10*3/uL — ABNORMAL LOW (ref 4.0–10.5)

## 2018-02-12 LAB — COMPREHENSIVE METABOLIC PANEL
ALBUMIN: 4.1 g/dL (ref 3.5–5.0)
ALT: 51 U/L — ABNORMAL HIGH (ref 0–44)
AST: 87 U/L — AB (ref 15–41)
Alkaline Phosphatase: 53 U/L (ref 38–126)
Anion gap: 16 — ABNORMAL HIGH (ref 5–15)
BILIRUBIN TOTAL: 0.5 mg/dL (ref 0.3–1.2)
BUN: 15 mg/dL (ref 6–20)
CHLORIDE: 101 mmol/L (ref 98–111)
CO2: 22 mmol/L (ref 22–32)
Calcium: 8.5 mg/dL — ABNORMAL LOW (ref 8.9–10.3)
Creatinine, Ser: 1.06 mg/dL (ref 0.61–1.24)
GFR calc Af Amer: >60 mL/min (ref >60)
GFR calc non Af Amer: >60 mL/min (ref >60)
GLUCOSE: 77 mg/dL (ref 70–99)
POTASSIUM: 3.6 mmol/L (ref 3.5–5.1)
SODIUM: 139 mmol/L (ref 135–145)
Total Protein: 6.7 g/dL (ref 6.5–8.1)

## 2018-02-12 LAB — I-STAT TROPONIN, ED: Troponin i, poc: 0 ng/mL (ref 0.00–0.08)

## 2018-02-12 MED ORDER — SODIUM CHLORIDE 0.9 % IV BOLUS
1000.0000 mL | Freq: Once | INTRAVENOUS | Status: AC
  Administered 2018-02-12: 1000 mL via INTRAVENOUS

## 2018-02-12 MED ORDER — LORAZEPAM 2 MG/ML IJ SOLN
1.0000 mg | Freq: Once | INTRAMUSCULAR | Status: AC
Start: 2018-02-12 — End: 2018-02-12
  Administered 2018-02-12: 1 mg via INTRAVENOUS
  Filled 2018-02-12: qty 1
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
## 2018-02-12 NOTE — ED Notes (Signed)
Patient called for vitals recheck x3 no answer

## 2018-02-12 NOTE — ED Notes (Signed)
Patient given discharge instructions and verbalized understanding.  Patient stable to discharge at this time.  Patient is alert and oriented to baseline.  No distressed noted at this time.  All belongings taken with the patient at discharge.   

## 2018-02-12 NOTE — Progress Notes (Signed)
CSW met with patient to discuss ETOH use and homelessness issues. CSW noted patient reported he has been drinking consistently the last 20 days and did not identify any specific trigger for this episode of heavy drinking. CSW inquired into patient's perspective on alcohol cessation and noted patient did not want to quit drinking or seek treatment. CSW noted patient accepted resources on substance use treatment programs.  CSW inquired if patient was staying with any friends or family and noted patient reported he had no friends or family. CSW discussed homelessness resources in [place] and discussed shelters and [facility]. CSW noted patient was short and abrupt and would only answer in brief statements and did not engage in conversation. CSW will continue to follow.  Jay Richer, LCSW, [place] Worker II [phone]

## 2018-02-12 NOTE — ED Notes (Signed)
No answer for vitals recheck x1 

## 2018-02-12 NOTE — ED Notes (Signed)
No answer for vitals recheck x2 

## 2018-02-12 NOTE — ED Triage Notes (Signed)
Pt reports having left arm pain at the site of an IV from 01/27/2018. Pt states his arm feels like it is sleeping. Pt admits to "many many" drinks tonight.

## 2018-02-12 NOTE — ED Provider Notes (Signed)
[facility] EMERGENCY DEPARTMENT Provider Note   CSN: [id number] Arrival date & time: 02/12/18  0755     History   Chief Complaint Chief Complaint  Patient presents with  . Alcohol Intoxication    HPI Jay [name] is a 53 y.o. male.  HPI   Presents with concern for numbness of distal arm Reports it began 3-4 days ago and has been constant Began after he had slept on arm, but also reports it began after having IV placed in arm after ED visit.   Denies other concerns   When stop drinking has shakes and tremors  No headache, no chest pain, no shortness of breath, no numbness or weakness in legs, face, no difficulty talking or walking, no proximal arm numbness.   Past Medical History:  Diagnosis Date  . Alcohol abuse   . Homeless     Patient Active Problem List   Diagnosis Date Noted  . Alcohol dependence (HCC) 08/13/2011    History reviewed. No pertinent surgical history.      Home Medications    Prior to Admission medications   Not on File    Family History No family history on file.  Social History Social History   Tobacco Use  . Smoking status: Never Smoker  . Smokeless tobacco: Never Used  Substance Use Topics  . Alcohol use: Yes    Comment: six 24 oz cans of beer daily  . Drug use: No     Allergies   Patient has no known allergies.   Review of Systems Review of Systems  Constitutional: Negative for fever.  Eyes: Negative for visual disturbance.  Respiratory: Negative for shortness of breath.   Cardiovascular: Negative for chest pain.  Gastrointestinal: Negative for abdominal pain, nausea and vomiting.  Musculoskeletal: Negative for neck pain.  Skin: Negative for rash.  Neurological: Positive for numbness. Negative for syncope, facial asymmetry, speech difficulty, weakness and headaches.     Physical Exam Updated Vital Signs BP 120/77 (BP Location: Right Arm)   Pulse 90   Temp 98.8 F (37.1 C) (Oral)    Resp 18   SpO2 97%   Physical Exam Vitals signs and nursing note reviewed.  Constitutional:      General: He is not in acute distress.    Appearance: He is well-developed. He is not diaphoretic.  HENT:     Head: Normocephalic and atraumatic.  Eyes:     Conjunctiva/sclera: Conjunctivae normal.  Neck:     Musculoskeletal: Normal range of motion.  Cardiovascular:     Rate and Rhythm: Normal rate and regular rhythm.     Heart sounds: Normal heart sounds. No murmur. No friction rub. No gallop.   Pulmonary:     Effort: Pulmonary effort is normal. No respiratory distress.     Breath sounds: Normal breath sounds. No wheezing or rales.  Abdominal:     General: There is no distension.     Palpations: Abdomen is soft.     Tenderness: There is no abdominal tenderness. There is no guarding.  Skin:    General: Skin is warm and dry.  Neurological:     Mental Status: He is alert and oriented to person, place, and time.     Cranial Nerves: No cranial nerve deficit, dysarthria or facial asymmetry.     Sensory: Sensory deficit: reports paresthesias left lower arm and hand.     Motor: Tremor (very mild with arms extended or tongue protrusion) present. No weakness.  Coordination: Coordination normal.      ED Treatments / Results  Labs (all labs ordered are listed, but only abnormal results are displayed) Labs Reviewed  CBC WITH DIFFERENTIAL/PLATELET - Abnormal; Notable for the following components:      Result Value   WBC 3.1 (*)    RBC 3.75 (*)    Hemoglobin 12.5 (*)    HCT 37.0 (*)    Platelets 134 (*)    Lymphs Abs 0.6 (*)    All other components within normal limits  COMPREHENSIVE METABOLIC PANEL - Abnormal; Notable for the following components:   Calcium 8.5 (*)    AST 87 (*)    ALT 51 (*)    Anion gap 16 (*)    All other components within normal limits  I-STAT TROPONIN, ED    EKG EKG Interpretation  Date/Time:  Saturday February 12 2018 09:36:56 EST Ventricular Rate:    78 PR Interval:    QRS Duration: 95 QT Interval:  394 QTC Calculation: 449 R Axis:   -88 Text Interpretation:  Sinus rhythm Left anterior fascicular block Probable anteroseptal infarct, recent Baseline wander in lead(s) V4 V6 No significant change since last tracing Confirmed by Alvira Monday ([id number]) on 02/12/2018 9:59:46 AM   Radiology No results found.  Procedures Procedures (including critical care time)  Medications Ordered in ED Medications  LORazepam (ATIVAN) injection 1 mg (1 mg Intravenous Given 02/12/18 0931)  sodium chloride 0.9 % bolus 1,000 mL (0 mLs Intravenous Stopped 02/12/18 1034)     Initial Impression / Assessment and Plan / ED Course  I have reviewed the triage vital signs and the nursing notes.  Pertinent labs & imaging results that were available during my care of the patient were reviewed by me and considered in my medical decision making (see chart for details).    53 year old male with a history of alcohol abuse and dependence and homelessness presents with concern for left lower arm tingling and paresthesias, which developed after he slept on that arm and also had an IV placed in the arm.  He denies other falls or trauma, headache, neck pain, other neurologic symptoms and has normal strength of upper and lower ext, no sign of CN abnormality, and doubt CVA or CSpine fracture.  Distribution of symptoms also would be atypical for central etiology.  No sign of significant electrolyte abnormality as cause. No sign of cardiac etiology. Good pulses and doubt acute arterial occlusion of dissection. No sign of UE DVT. No sign of compartment syndrome, abscess or cellulitis.   Suspect likely peripheral nerve compression from sleeping on area. Has good strength of opponens, abduction, wrist extension.  Gave number for neurology follow up, other etiologies include vitamin deficiency and polyneuropathy.  Patient initially reports he would like help with etoh cessation,  however for LCSW he declined help. Provided resources.  No sign of significant withdrawal in ED that would require admission. Very mild tremor on arrival treated with .5mg  ativan. Patient discharged in stable condition with understanding of reasons to return.    Final Clinical Impressions(s) / ED Diagnoses   Final diagnoses:  Numbness and tingling in left arm  Peripheral nerve injury  Alcohol dependence with unspecified alcohol-induced disorder [facility])    ED Discharge Orders    None       Alvira Monday, MD 02/12/18 [phone]

## 2018-05-10 ENCOUNTER — Emergency Department (HOSPITAL_COMMUNITY): Payer: Self-pay

## 2018-05-10 ENCOUNTER — Other Ambulatory Visit: Payer: Self-pay

## 2018-05-10 ENCOUNTER — Emergency Department (HOSPITAL_COMMUNITY)
Admission: EM | Admit: 2018-05-10 | Discharge: 2018-05-11 | Disposition: A | Discharge status: Home / Self Care | Payer: Self-pay | Attending: Emergency Medicine | Admitting: Emergency Medicine

## 2018-05-10 ENCOUNTER — Encounter (HOSPITAL_COMMUNITY): Payer: Self-pay

## 2018-05-10 DIAGNOSIS — Z590 Homelessness: Secondary | ICD-10-CM | POA: Insufficient documentation

## 2018-05-10 DIAGNOSIS — Y908 Blood alcohol level of 240 mg/100 ml or more: Secondary | ICD-10-CM | POA: Insufficient documentation

## 2018-05-10 DIAGNOSIS — Y929 Unspecified place or not applicable: Secondary | ICD-10-CM | POA: Insufficient documentation

## 2018-05-10 DIAGNOSIS — Y939 Activity, unspecified: Secondary | ICD-10-CM | POA: Insufficient documentation

## 2018-05-10 DIAGNOSIS — F17210 Nicotine dependence, cigarettes, uncomplicated: Secondary | ICD-10-CM | POA: Insufficient documentation

## 2018-05-10 DIAGNOSIS — F10920 Alcohol use, unspecified with intoxication, uncomplicated: Secondary | ICD-10-CM | POA: Insufficient documentation

## 2018-05-10 DIAGNOSIS — Y999 Unspecified external cause status: Secondary | ICD-10-CM | POA: Insufficient documentation

## 2018-05-10 DIAGNOSIS — S0083XA Contusion of other part of head, initial encounter: Secondary | ICD-10-CM | POA: Insufficient documentation

## 2018-05-10 LAB — COMPREHENSIVE METABOLIC PANEL
ALT: 31 U/L (ref 0–44)
AST: 54 U/L — ABNORMAL HIGH (ref 15–41)
Albumin: 4.5 g/dL (ref 3.5–5.0)
Alkaline Phosphatase: 75 U/L (ref 38–126)
Anion gap: 14 (ref 5–15)
BUN: 9 mg/dL (ref 6–20)
CO2: 23 mmol/L (ref 22–32)
Calcium: 8.6 mg/dL — ABNORMAL LOW (ref 8.9–10.3)
Chloride: 102 mmol/L (ref 98–111)
Creatinine, Ser: 1.06 mg/dL (ref 0.61–1.24)
GFR calc Af Amer: >60 mL/min (ref >60)
GFR calc non Af Amer: >60 mL/min (ref >60)
Glucose, Bld: 105 mg/dL — ABNORMAL HIGH (ref 70–99)
Potassium: 3.4 mmol/L — ABNORMAL LOW (ref 3.5–5.1)
Sodium: 139 mmol/L (ref 135–145)
Total Bilirubin: 0.6 mg/dL (ref 0.3–1.2)
Total Protein: 7.8 g/dL (ref 6.5–8.1)

## 2018-05-10 LAB — CBC WITH DIFFERENTIAL/PLATELET
Abs Immature Granulocytes: 0.03 10*3/uL (ref 0.00–0.07)
Basophils Absolute: 0 10*3/uL (ref 0.0–0.1)
Basophils Relative: 0 %
Eosinophils Absolute: 0 10*3/uL (ref 0.0–0.5)
Eosinophils Relative: 0 %
HCT: 37.5 % — ABNORMAL LOW (ref 39.0–52.0)
Hemoglobin: 13.4 g/dL (ref 13.0–17.0)
Immature Granulocytes: 1 %
Lymphocytes Relative: 23 %
Lymphs Abs: 1 10*3/uL (ref 0.7–4.0)
MCH: 34 pg (ref 26.0–34.0)
MCHC: 35.7 g/dL (ref 30.0–36.0)
MCV: 95.2 fL (ref 80.0–100.0)
Monocytes Absolute: 0.3 10*3/uL (ref 0.1–1.0)
Monocytes Relative: 7 %
Neutro Abs: 3.2 10*3/uL (ref 1.7–7.7)
Neutrophils Relative %: 69 %
Platelets: 186 10*3/uL (ref 150–400)
RBC: 3.94 MIL/uL — ABNORMAL LOW (ref 4.22–5.81)
RDW: 11.9 % (ref 11.5–15.5)
WBC: 4.6 10*3/uL (ref 4.0–10.5)
nRBC: 0 % (ref 0.0–0.2)

## 2018-05-10 LAB — ETHANOL: Alcohol, Ethyl (B): 438 mg/dL (ref <10)

## 2018-05-10 LAB — RAPID URINE DRUG SCREEN, HOSP PERFORMED
Amphetamines: NOT DETECTED
Barbiturates: NOT DETECTED
Benzodiazepines: NOT DETECTED
Cocaine: NOT DETECTED
Opiates: NOT DETECTED
Tetrahydrocannabinol: NOT DETECTED

## 2018-05-10 LAB — LIPASE, BLOOD: Lipase: 47 U/L (ref 11–51)

## 2018-05-10 MED ORDER — ONDANSETRON HCL 4 MG/2ML IJ SOLN
4.0000 mg | Freq: Once | INTRAMUSCULAR | Status: AC
  Administered 2018-05-10: 4 mg via INTRAVENOUS
  Filled 2018-05-10: qty 2

## 2018-05-10 MED ORDER — THIAMINE HCL 100 MG/ML IJ SOLN
Freq: Once | INTRAMUSCULAR | Status: AC
  Administered 2018-05-10: 21:00:00 via INTRAVENOUS
  Filled 2018-05-10: qty 1000

## 2018-05-10 NOTE — ED Notes (Signed)
Sandwich and water given to pt.

## 2018-05-10 NOTE — ED Notes (Signed)
Date and time results received: 05/10/18 9:26 PM  (use smartphrase ".now" to insert current time)  Test: Ethanol Critical Value: 438  Name of Provider Notified: Hina, PA  Orders Received? Or Actions Taken?:
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
## 2018-05-10 NOTE — ED Notes (Addendum)
Patient transported to CT. Patient clothing removed and placed in patient belongings bag at bedside.

## 2018-05-10 NOTE — ED Notes (Signed)
Bed: YB33 Expected date:  Expected time:  Means of arrival:  Comments: 53 yo M/ ETOH
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
## 2018-05-10 NOTE — ED Notes (Signed)
Patient refusing to ambulate, stating "I am too weak right now, ask me again at 8 AM." Patient refuses to sit up in bed or sit on edge of bed. Denies dizziness and headache, just complaining of weakness. EDP made aware of refusal.
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
## 2018-05-11 NOTE — ED Notes (Signed)
PATIENT WALKED IN HALL WITH NO HELP

## 2018-05-11 NOTE — ED Notes (Signed)
Tried to ambulate pt . Pt able to stand but states he is not drunk his feet hurt and that's why he cant walk.

## 2018-05-11 NOTE — ED Notes (Signed)
Pt ambulated roughly 30 ft with unsteady gait with out assist.

## 2018-05-11 NOTE — ED Notes (Signed)
Discharge paperwork reviewed with pt, pt alert and calm at this time.  Pt ambulatory to front ED entrance.

## 2018-05-11 NOTE — ED Notes (Signed)
Pt said he is not ready to ambulate

## 2018-05-19 ENCOUNTER — Encounter (HOSPITAL_COMMUNITY): Payer: Self-pay

## 2018-05-19 ENCOUNTER — Emergency Department (HOSPITAL_COMMUNITY): Payer: Self-pay

## 2018-05-19 ENCOUNTER — Emergency Department (HOSPITAL_COMMUNITY)
Admission: EM | Admit: 2018-05-19 | Discharge: 2018-05-19 | Disposition: A | Discharge status: Home / Self Care | Payer: Self-pay | Attending: Emergency Medicine | Admitting: Emergency Medicine

## 2018-05-19 ENCOUNTER — Other Ambulatory Visit: Payer: Self-pay

## 2018-05-19 DIAGNOSIS — M25552 Pain in left hip: Secondary | ICD-10-CM | POA: Insufficient documentation

## 2018-05-19 DIAGNOSIS — Y929 Unspecified place or not applicable: Secondary | ICD-10-CM | POA: Insufficient documentation

## 2018-05-19 DIAGNOSIS — S82892A Other fracture of left lower leg, initial encounter for closed fracture: Secondary | ICD-10-CM | POA: Insufficient documentation

## 2018-05-19 DIAGNOSIS — F17210 Nicotine dependence, cigarettes, uncomplicated: Secondary | ICD-10-CM | POA: Insufficient documentation

## 2018-05-19 DIAGNOSIS — Z590 Homelessness: Secondary | ICD-10-CM | POA: Insufficient documentation

## 2018-05-19 DIAGNOSIS — Y999 Unspecified external cause status: Secondary | ICD-10-CM | POA: Insufficient documentation

## 2018-05-19 DIAGNOSIS — M25562 Pain in left knee: Secondary | ICD-10-CM | POA: Insufficient documentation

## 2018-05-19 DIAGNOSIS — R4182 Altered mental status, unspecified: Secondary | ICD-10-CM | POA: Insufficient documentation

## 2018-05-19 DIAGNOSIS — Y939 Activity, unspecified: Secondary | ICD-10-CM | POA: Insufficient documentation

## 2018-05-19 MED ORDER — ONDANSETRON 4 MG PO TBDP
4.0000 mg | ORAL_TABLET | Freq: Once | ORAL | Status: AC
  Administered 2018-05-19: 18:00:00 4 mg via ORAL
  Filled 2018-05-19: qty 1

## 2018-05-19 NOTE — Discharge Instructions (Signed)
Follow up with the ortho doc in the office.

## 2018-05-19 NOTE — ED Notes (Signed)
Bed: QI34 Expected date:  Expected time:  Means of arrival:  Comments: EMS ETOH hip pain
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
## 2018-05-19 NOTE — ED Notes (Signed)
Pt resting in bed.
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
## 2018-07-20 ENCOUNTER — Emergency Department (HOSPITAL_COMMUNITY)
Admission: EM | Admit: 2018-07-20 | Discharge: 2018-07-20 | Disposition: A | Discharge status: Home / Self Care | Payer: Self-pay | Attending: Emergency Medicine | Admitting: Emergency Medicine

## 2018-07-20 ENCOUNTER — Encounter (HOSPITAL_COMMUNITY): Payer: Self-pay | Admitting: Emergency Medicine

## 2018-07-20 ENCOUNTER — Other Ambulatory Visit: Payer: Self-pay

## 2018-07-20 DIAGNOSIS — F17210 Nicotine dependence, cigarettes, uncomplicated: Secondary | ICD-10-CM | POA: Insufficient documentation

## 2018-07-20 DIAGNOSIS — Z590 Homelessness: Secondary | ICD-10-CM | POA: Insufficient documentation

## 2018-07-20 DIAGNOSIS — F10920 Alcohol use, unspecified with intoxication, uncomplicated: Secondary | ICD-10-CM
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
## 2018-07-20 NOTE — ED Notes (Signed)
Bed: [facility]4 Expected date:  Expected time:  Means of arrival:  Comments: 

## 2019-06-07 ENCOUNTER — Other Ambulatory Visit: Payer: Self-pay

## 2019-06-07 DIAGNOSIS — F10920 Alcohol use, unspecified with intoxication, uncomplicated: Secondary | ICD-10-CM | POA: Insufficient documentation

## 2019-06-07 DIAGNOSIS — F17210 Nicotine dependence, cigarettes, uncomplicated: Secondary | ICD-10-CM | POA: Insufficient documentation

## 2019-06-07 DIAGNOSIS — R4182 Altered mental status, unspecified: Secondary | ICD-10-CM | POA: Insufficient documentation

## 2019-06-08 ENCOUNTER — Encounter (HOSPITAL_COMMUNITY): Payer: Self-pay

## 2019-06-08 ENCOUNTER — Other Ambulatory Visit: Payer: Self-pay

## 2019-06-08 ENCOUNTER — Emergency Department (HOSPITAL_COMMUNITY)
Admission: EM | Admit: 2019-06-08 | Discharge: 2019-06-08 | Disposition: A | Discharge status: Home / Self Care | Payer: Self-pay | Attending: Emergency Medicine | Admitting: Emergency Medicine

## 2019-06-08 ENCOUNTER — Emergency Department (HOSPITAL_COMMUNITY): Payer: Self-pay

## 2019-06-08 DIAGNOSIS — F10920 Alcohol use, unspecified with intoxication, uncomplicated: Secondary | ICD-10-CM

## 2019-06-08 NOTE — ED Provider Notes (Signed)
[facility]-EMERGENCY DEPT Provider Note   CSN: [id number] Arrival date & time: 06/07/19  2355     History Chief Complaint  Patient presents with  . Alcohol Intoxication    Jay [name] is a 54 y.o. male.  Patient is a 54 year old gentleman with history of alcohol dependence and homelessness who was brought in to the emergency department by EMS for being found on the side of the road drunk.  Patient is alert and oriented and says that he is here in the hospital because he drank too much.  He denies drug use.  Denies injury or trauma.        Past Medical History:  Diagnosis Date  . Alcohol abuse   . Homeless     Patient Active Problem List   Diagnosis Date Noted  . Alcohol dependence (HCC) 08/13/2011    History reviewed. No pertinent surgical history.     No family history on file.  Social History   Tobacco Use  . Smoking status: Current Every Day Smoker    Types: Cigarettes  . Smokeless tobacco: Never Used  Substance Use Topics  . Alcohol use: Yes    Comment: six 24 oz cans of beer daily  . Drug use: No    Home Medications Prior to Admission medications   Not on File    Allergies    Patient has no known allergies.  Review of Systems   Review of Systems  Constitutional: Negative for chills and fever.  HENT: Negative for congestion.   Eyes: Negative for visual disturbance.  Respiratory: Negative for shortness of breath.   Cardiovascular: Negative for chest pain.  Gastrointestinal: Negative for abdominal pain, nausea and vomiting.  Musculoskeletal: Negative for arthralgias, myalgias and neck pain.  Skin: Negative for rash and wound.  Neurological: Negative for dizziness, syncope and headaches.  Psychiatric/Behavioral: Negative for confusion.    Physical Exam Updated Vital Signs BP (!) 154/108 (BP Location: Right Arm)   Pulse 89   Temp 97.9 F (36.6 C) (Oral)   Resp 17   Ht 5\' 5"  (1.651 m)   Wt 59 kg   SpO2 94%   BMI  21.63 kg/m   Physical Exam Vitals and nursing note reviewed.  Constitutional:      Appearance: Normal appearance.  HENT:     Head: Normocephalic.     Mouth/Throat:     Mouth: Mucous membranes are moist.  Eyes:     Conjunctiva/sclera: Conjunctivae normal.  Cardiovascular:     Rate and Rhythm: Normal rate and regular rhythm.  Pulmonary:     Effort: Pulmonary effort is normal.  Skin:    General: Skin is dry.  Neurological:     Mental Status: He is alert and oriented to person, place, and time.     Comments: Patient appears intoxicated and was sleepy but easily awakened  Psychiatric:        Mood and Affect: Mood normal.     ED Results / Procedures / Treatments   Labs (all labs ordered are listed, but only abnormal results are displayed) Labs Reviewed - No data to display  EKG None  Radiology CT Head Wo Contrast  Result Date: 06/08/2019 CLINICAL DATA:  Initial evaluation for acute trauma, altered mental status. EXAM: CT HEAD WITHOUT CONTRAST CT MAXILLOFACIAL WITHOUT CONTRAST CT CERVICAL SPINE WITHOUT CONTRAST TECHNIQUE: Multidetector CT imaging of the head, cervical spine, and maxillofacial structures were performed using the standard protocol without intravenous contrast. Multiplanar CT image reconstructions of the  cervical spine and maxillofacial structures were also generated. COMPARISON:  Prior head CT from 05/10/2018. FINDINGS: CT HEAD FINDINGS Brain: Mild age-related cerebral atrophy. No acute intracranial hemorrhage. No acute large vessel territory infarct. No mass lesion, midline shift or mass effect. No hydrocephalus or extra-axial fluid collection. Vascular: No hyperdense vessel. Skull: Scalp soft tissues and calvarium within normal limits. Other: Mastoid air cells are clear. CT MAXILLOFACIAL FINDINGS Osseous: Examination mildly degraded by motion artifact. Chronic left zygomatic arch fracture noted. Right zygomatic arch intact. No acute maxillary fracture. Pterygoid plates  intact. Chronic irregularity about the nasal bones bilaterally. Right-to-left nasal septal deviation without acute fracture. No acute mandibular fracture. Mandibular condyles normally situated. Multiple scattered dental caries noted about the teeth. No acute abnormality about the dentition. Orbits: Chronic right medial blowout fracture noted. Bony orbits otherwise intact without acute osseous abnormality. Globes and orbital soft tissues within normal limits. Sinuses: Mild-to-moderate mucosal thickening seen throughout the paranasal sinuses, allergic/inflammatory nature. No hemosinus. Soft tissues: No appreciable soft tissue injury about the face. CT CERVICAL SPINE FINDINGS Alignment: Examination mildly degraded by motion artifact. Straightening of the normal cervical lordosis. No listhesis or malalignment. Skull base and vertebrae: Skull base intact. Normal C1-2 articulations are preserved in the dens is intact. Vertebral body heights maintained. No acute fracture. Soft tissues and spinal canal: Soft tissues of the neck demonstrate no acute finding. No abnormal prevertebral edema. Spinal canal within normal limits. Disc levels: Mild multilevel cervical spondylosis at C3-4 through C6-7 without high-grade stenosis. Upper chest: Visualized upper chest demonstrates no acute finding. Partially visualized lung apices are clear. Other: None. IMPRESSION: CT HEAD: 1. No acute intracranial abnormality. 2. Mild age-related cerebral atrophy. CT MAXILLOFACIAL: 1. No acute maxillofacial injury.  No fracture. 2. Chronic right medial orbital blowout fracture, with additional chronic left zygomatic arch fracture. 3. Poor dentition. CT CERVICAL SPINE: 1. Mildly motion degraded exam. 2. No definite acute traumatic injury within the cervical spine. 3. Mild multilevel cervical spondylosis at C3-4 through C6-7. Electronically Signed   By: Rise Mu M.D.   On: 06/08/2019 02:15   CT Cervical Spine Wo Contrast  Result Date:  06/08/2019 CLINICAL DATA:  Initial evaluation for acute trauma, altered mental status. EXAM: CT HEAD WITHOUT CONTRAST CT MAXILLOFACIAL WITHOUT CONTRAST CT CERVICAL SPINE WITHOUT CONTRAST TECHNIQUE: Multidetector CT imaging of the head, cervical spine, and maxillofacial structures were performed using the standard protocol without intravenous contrast. Multiplanar CT image reconstructions of the cervical spine and maxillofacial structures were also generated. COMPARISON:  Prior head CT from 05/10/2018. FINDINGS: CT HEAD FINDINGS Brain: Mild age-related cerebral atrophy. No acute intracranial hemorrhage. No acute large vessel territory infarct. No mass lesion, midline shift or mass effect. No hydrocephalus or extra-axial fluid collection. Vascular: No hyperdense vessel. Skull: Scalp soft tissues and calvarium within normal limits. Other: Mastoid air cells are clear. CT MAXILLOFACIAL FINDINGS Osseous: Examination mildly degraded by motion artifact. Chronic left zygomatic arch fracture noted. Right zygomatic arch intact. No acute maxillary fracture. Pterygoid plates intact. Chronic irregularity about the nasal bones bilaterally. Right-to-left nasal septal deviation without acute fracture. No acute mandibular fracture. Mandibular condyles normally situated. Multiple scattered dental caries noted about the teeth. No acute abnormality about the dentition. Orbits: Chronic right medial blowout fracture noted. Bony orbits otherwise intact without acute osseous abnormality. Globes and orbital soft tissues within normal limits. Sinuses: Mild-to-moderate mucosal thickening seen throughout the paranasal sinuses, allergic/inflammatory nature. No hemosinus. Soft tissues: No appreciable soft tissue injury about the face. CT CERVICAL SPINE FINDINGS  Alignment: Examination mildly degraded by motion artifact. Straightening of the normal cervical lordosis. No listhesis or malalignment. Skull base and vertebrae: Skull base intact. Normal  C1-2 articulations are preserved in the dens is intact. Vertebral body heights maintained. No acute fracture. Soft tissues and spinal canal: Soft tissues of the neck demonstrate no acute finding. No abnormal prevertebral edema. Spinal canal within normal limits. Disc levels: Mild multilevel cervical spondylosis at C3-4 through C6-7 without high-grade stenosis. Upper chest: Visualized upper chest demonstrates no acute finding. Partially visualized lung apices are clear. Other: None. IMPRESSION: CT HEAD: 1. No acute intracranial abnormality. 2. Mild age-related cerebral atrophy. CT MAXILLOFACIAL: 1. No acute maxillofacial injury.  No fracture. 2. Chronic right medial orbital blowout fracture, with additional chronic left zygomatic arch fracture. 3. Poor dentition. CT CERVICAL SPINE: 1. Mildly motion degraded exam. 2. No definite acute traumatic injury within the cervical spine. 3. Mild multilevel cervical spondylosis at C3-4 through C6-7. Electronically Signed   By: Rise Mu M.D.   On: 06/08/2019 02:15   CT Maxillofacial Wo Contrast  Result Date: 06/08/2019 CLINICAL DATA:  Initial evaluation for acute trauma, altered mental status. EXAM: CT HEAD WITHOUT CONTRAST CT MAXILLOFACIAL WITHOUT CONTRAST CT CERVICAL SPINE WITHOUT CONTRAST TECHNIQUE: Multidetector CT imaging of the head, cervical spine, and maxillofacial structures were performed using the standard protocol without intravenous contrast. Multiplanar CT image reconstructions of the cervical spine and maxillofacial structures were also generated. COMPARISON:  Prior head CT from 05/10/2018. FINDINGS: CT HEAD FINDINGS Brain: Mild age-related cerebral atrophy. No acute intracranial hemorrhage. No acute large vessel territory infarct. No mass lesion, midline shift or mass effect. No hydrocephalus or extra-axial fluid collection. Vascular: No hyperdense vessel. Skull: Scalp soft tissues and calvarium within normal limits. Other: Mastoid air cells are  clear. CT MAXILLOFACIAL FINDINGS Osseous: Examination mildly degraded by motion artifact. Chronic left zygomatic arch fracture noted. Right zygomatic arch intact. No acute maxillary fracture. Pterygoid plates intact. Chronic irregularity about the nasal bones bilaterally. Right-to-left nasal septal deviation without acute fracture. No acute mandibular fracture. Mandibular condyles normally situated. Multiple scattered dental caries noted about the teeth. No acute abnormality about the dentition. Orbits: Chronic right medial blowout fracture noted. Bony orbits otherwise intact without acute osseous abnormality. Globes and orbital soft tissues within normal limits. Sinuses: Mild-to-moderate mucosal thickening seen throughout the paranasal sinuses, allergic/inflammatory nature. No hemosinus. Soft tissues: No appreciable soft tissue injury about the face. CT CERVICAL SPINE FINDINGS Alignment: Examination mildly degraded by motion artifact. Straightening of the normal cervical lordosis. No listhesis or malalignment. Skull base and vertebrae: Skull base intact. Normal C1-2 articulations are preserved in the dens is intact. Vertebral body heights maintained. No acute fracture. Soft tissues and spinal canal: Soft tissues of the neck demonstrate no acute finding. No abnormal prevertebral edema. Spinal canal within normal limits. Disc levels: Mild multilevel cervical spondylosis at C3-4 through C6-7 without high-grade stenosis. Upper chest: Visualized upper chest demonstrates no acute finding. Partially visualized lung apices are clear. Other: None. IMPRESSION: CT HEAD: 1. No acute intracranial abnormality. 2. Mild age-related cerebral atrophy. CT MAXILLOFACIAL: 1. No acute maxillofacial injury.  No fracture. 2. Chronic right medial orbital blowout fracture, with additional chronic left zygomatic arch fracture. 3. Poor dentition. CT CERVICAL SPINE: 1. Mildly motion degraded exam. 2. No definite acute traumatic injury within  the cervical spine. 3. Mild multilevel cervical spondylosis at C3-4 through C6-7. Electronically Signed   By: Rise Mu M.D.   On: 06/08/2019 02:15    Procedures Procedures (  including critical care time)  Medications Ordered in ED Medications - No data to display  ED Course  I have reviewed the triage vital signs and the nursing notes.  Pertinent labs & imaging results that were available during my care of the patient were reviewed by me and considered in my medical decision making (see chart for details).  Clinical Course as of Jun 07 625  Thu Jun 08, 2019  0032 Previous notes indicate that patient has had multiple visits in the ER for uncomplicated alcohol intoxication.  He does not have any focal neuro deficits and is moving all 4 extremities, and has normal-appearing pupils without but does have dried blood in bilateral nares. Denies cocaine or other intranasal drug use so I will order scans to assess for head and neck trauma. If those are normal, plan is to just monitor him closely.  He can be discharged when sober.   [KM]  0522 Patient imaging is unremarkable for acute finding.  He has been observed for several hours and times a week, oriented and is moving all 4 extremities.  He is clinically sober and okay for discharge.   [KM]    Clinical Course User Index [KM] Kristine Royal   MDM Rules/Calculators/A&P                      Based on review of vitals, medical screening exam, lab work and/or imaging, there does not appear to be an acute, emergent etiology for the patient's symptoms. Counseled pt on good return precautions and encouraged both PCP and ED follow-up as needed.  Prior to discharge, I also discussed incidental imaging findings with patient in detail and advised appropriate, recommended follow-up in detail.  Clinical Impression: 1. Alcoholic intoxication without complication ([place])     Disposition: Discharge  Prior to providing a prescription  for a controlled substance, I independently reviewed the patient's recent prescription history on the [place]. The patient had no recent or regular prescriptions and was deemed appropriate for a brief, less than 3 day prescription of narcotic for acute analgesia.  This note was prepared with assistance of Systems analyst. Occasional wrong-word or sound-a-like substitutions may have occurred due to the inherent limitations of voice recognition software.  Final Clinical Impression(s) / ED Diagnoses Final diagnoses:  Alcoholic intoxication without complication [facility])    Rx / DC Orders ED Discharge Orders    None       Kristine Royal 06/08/19 1601    Ward, Delice Bison, DO 06/08/19 [phone]

## 2019-06-08 NOTE — ED Triage Notes (Signed)
Found on side of road intoxicated. EMS applied C-collar since to hold his straight not for cervical damage.

## 2019-08-12 ENCOUNTER — Encounter (HOSPITAL_COMMUNITY): Payer: Self-pay | Admitting: Emergency Medicine

## 2019-08-12 ENCOUNTER — Other Ambulatory Visit: Payer: Self-pay

## 2019-08-12 ENCOUNTER — Emergency Department (HOSPITAL_COMMUNITY)
Admission: EM | Admit: 2019-08-12 | Discharge: 2019-08-12 | Disposition: A | Discharge status: Home / Self Care | Payer: Self-pay | Attending: Emergency Medicine | Admitting: Emergency Medicine

## 2019-08-12 ENCOUNTER — Emergency Department (HOSPITAL_COMMUNITY): Payer: Self-pay

## 2019-08-12 DIAGNOSIS — Y939 Activity, unspecified: Secondary | ICD-10-CM | POA: Insufficient documentation

## 2019-08-12 DIAGNOSIS — S0993XA Unspecified injury of face, initial encounter: Secondary | ICD-10-CM

## 2019-08-12 DIAGNOSIS — S00502A Unspecified superficial injury of oral cavity, initial encounter: Secondary | ICD-10-CM | POA: Insufficient documentation

## 2019-08-12 DIAGNOSIS — Y929 Unspecified place or not applicable: Secondary | ICD-10-CM | POA: Insufficient documentation

## 2019-08-12 DIAGNOSIS — S0990XA Unspecified injury of head, initial encounter: Secondary | ICD-10-CM

## 2019-08-12 DIAGNOSIS — F17210 Nicotine dependence, cigarettes, uncomplicated: Secondary | ICD-10-CM | POA: Insufficient documentation

## 2019-08-12 DIAGNOSIS — Y999 Unspecified external cause status: Secondary | ICD-10-CM | POA: Insufficient documentation

## 2019-08-12 LAB — COMPREHENSIVE METABOLIC PANEL
ALT: 19 U/L (ref 0–44)
AST: 23 U/L (ref 15–41)
Albumin: 4.1 g/dL (ref 3.5–5.0)
Alkaline Phosphatase: 55 U/L (ref 38–126)
Anion gap: 13 (ref 5–15)
BUN: 9 mg/dL (ref 6–20)
CO2: 18 mmol/L — ABNORMAL LOW (ref 22–32)
Calcium: 8.5 mg/dL — ABNORMAL LOW (ref 8.9–10.3)
Chloride: 110 mmol/L (ref 98–111)
Creatinine, Ser: 1.3 mg/dL — ABNORMAL HIGH (ref 0.61–1.24)
GFR calc Af Amer: >60 mL/min (ref >60)
GFR calc non Af Amer: >60 mL/min (ref >60)
Glucose, Bld: 109 mg/dL — ABNORMAL HIGH (ref 70–99)
Potassium: 3.1 mmol/L — ABNORMAL LOW (ref 3.5–5.1)
Sodium: 141 mmol/L (ref 135–145)
Total Bilirubin: 0.7 mg/dL (ref 0.3–1.2)
Total Protein: 6.6 g/dL (ref 6.5–8.1)

## 2019-08-12 LAB — CBC WITH DIFFERENTIAL/PLATELET
Abs Immature Granulocytes: 0.02 10*3/uL (ref 0.00–0.07)
Basophils Absolute: 0 10*3/uL (ref 0.0–0.1)
Basophils Relative: 0 %
Eosinophils Absolute: 0.1 10*3/uL (ref 0.0–0.5)
Eosinophils Relative: 2 %
HCT: 37.4 % — ABNORMAL LOW (ref 39.0–52.0)
Hemoglobin: 13.1 g/dL (ref 13.0–17.0)
Immature Granulocytes: 0 %
Lymphocytes Relative: 28 %
Lymphs Abs: 1.5 10*3/uL (ref 0.7–4.0)
MCH: 34 pg (ref 26.0–34.0)
MCHC: 35 g/dL (ref 30.0–36.0)
MCV: 97.1 fL (ref 80.0–100.0)
Monocytes Absolute: 0.2 10*3/uL (ref 0.1–1.0)
Monocytes Relative: 4 %
Neutro Abs: 3.5 10*3/uL (ref 1.7–7.7)
Neutrophils Relative %: 66 %
Platelets: 220 10*3/uL (ref 150–400)
RBC: 3.85 MIL/uL — ABNORMAL LOW (ref 4.22–5.81)
RDW: 13.5 % (ref 11.5–15.5)
WBC: 5.3 10*3/uL (ref 4.0–10.5)
nRBC: 0 % (ref 0.0–0.2)

## 2019-08-12 LAB — ETHANOL: Alcohol, Ethyl (B): 364 mg/dL (ref <10)

## 2019-08-12 MED ORDER — THIAMINE HCL 100 MG/ML IJ SOLN: 100.0000 mg | Freq: Every day | INTRAMUSCULAR | Status: DC

## 2019-08-12 MED ORDER — THIAMINE HCL 100 MG PO TABS: 100.0000 mg | ORAL_TABLET | Freq: Every day | ORAL | Status: DC

## 2019-08-12 MED ORDER — LORAZEPAM 1 MG PO TABS: 0.0000 mg | ORAL_TABLET | Freq: Four times a day (QID) | ORAL | Status: DC

## 2019-08-12 MED ORDER — LACTATED RINGERS IV BOLUS
1000.0000 mL | Freq: Once | INTRAVENOUS | Status: AC
  Administered 2019-08-12: 1000 mL via INTRAVENOUS

## 2019-08-12 MED ORDER — PENICILLIN V POTASSIUM 500 MG PO TABS: 500.0000 mg | ORAL_TABLET | Freq: Four times a day (QID) | ORAL | 0 refills | Status: AC

## 2019-08-12 MED ORDER — LORAZEPAM 1 MG PO TABS: 0.0000 mg | ORAL_TABLET | Freq: Two times a day (BID) | ORAL | Status: DC

## 2019-08-12 MED ORDER — LORAZEPAM 2 MG/ML IJ SOLN: 0.0000 mg | Freq: Two times a day (BID) | INTRAMUSCULAR | Status: DC

## 2019-08-12 MED ORDER — LORAZEPAM 2 MG/ML IJ SOLN: 0.0000 mg | Freq: Four times a day (QID) | INTRAMUSCULAR | Status: DC
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
## 2019-08-12 NOTE — Discharge Instructions (Addendum)
Follow-up with the dentist regarding your loose teeth.  Call to make an appointment.    Head Injury You have been seen today for a head injury, which may or may not have resulted in a concussion. It does not appear to be serious at this time, however, it is important to note that your presentation today is not necessarily an indication of the severity of future symptoms.  Expected symptoms: Expected symptoms of concussion and/or head injury can include nausea, headache, mild dizziness (should still be able to get up and walk around without difficulty), difficulty concentrating, increased sleep, difficulty sleeping, increased intensity of emotions. Close observation: The close observation period is usually 6 hours from the injury. This includes staying awake and having a trustworthy adult monitor you to assure your condition does not worsen. You should be in regular contact with this person and ideally, they should be able to monitor you in person.  Secondary observation: The secondary observation period is usually 24 hours from the injury. You are allowed to sleep during this time. A trustworthy adult should intermittently monitor you to assure your condition does not worsen.   Overall head injury/concussion care: Rest: Be sure to get plenty of rest. You will need more rest and sleep while you recover. Hydration: Be sure to stay well hydrated by having a goal of drinking about 0.5 liters of water an hour. Pain:  Antiinflammatory medications: Take 600 mg of ibuprofen every 6 hours or 440 mg (over the counter dose) to 500 mg (prescription dose) of naproxen every 12 hours or for the next 3 days. After this time, these medications may be used as needed for pain. Take these medications with food to avoid upset stomach. Choose only one of these medications, do not take them together. Tylenol: Should you continue to have additional pain while taking the ibuprofen or naproxen, you may add in tylenol as  needed. Your daily total maximum amount of tylenol from all sources should be limited to 4000mg /day for persons without liver problems, or 2000mg /day for those with liver problems. Return to sports and activities: In general, you may return to normal activities once symptoms have subsided, however, you would ideally be cleared by a primary care provider or other qualified medical professional prior to return to these activities.  Follow up: Follow up with the concussion clinic or your primary care provider for further management of this issue. Return: Return to the ED should you begin to have confusion, abnormal behavior, aggression, violence, or personality changes, repeated vomiting, vision loss, numbness or weakness on one side of the body, difficulty standing due to dizziness, significantly worsening pain, or any other major concerns.

## 2022-01-17 ENCOUNTER — Other Ambulatory Visit: Payer: Self-pay

## 2022-01-17 ENCOUNTER — Emergency Department (HOSPITAL_COMMUNITY): Payer: Self-pay

## 2022-01-17 ENCOUNTER — Emergency Department (HOSPITAL_COMMUNITY)
Admission: EM | Admit: 2022-01-17 | Discharge: 2022-01-18 | Disposition: A | Discharge status: Home / Self Care | Payer: Self-pay | Attending: Emergency Medicine | Admitting: Emergency Medicine

## 2022-01-17 DIAGNOSIS — R0781 Pleurodynia: Secondary | ICD-10-CM | POA: Insufficient documentation

## 2022-01-17 DIAGNOSIS — F10920 Alcohol use, unspecified with intoxication, uncomplicated: Secondary | ICD-10-CM

## 2022-01-17 DIAGNOSIS — R1012 Left upper quadrant pain: Secondary | ICD-10-CM | POA: Insufficient documentation

## 2022-01-17 DIAGNOSIS — F19920 Other psychoactive substance use, unspecified with intoxication, uncomplicated: Secondary | ICD-10-CM | POA: Insufficient documentation

## 2022-01-17 DIAGNOSIS — R519 Headache, unspecified: Secondary | ICD-10-CM | POA: Insufficient documentation
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
## 2022-01-17 NOTE — ED Notes (Signed)
Patient currently refusing to have IV placed, blood work, and CT.  States he does not want anything done

## 2022-01-17 NOTE — ED Notes (Signed)
Patient refused blood work and IV at this time.  2nd attempt made.  Will attempt again shortly

## 2022-01-17 NOTE — ED Notes (Signed)
CT made aware that patient is ready for CT and that he continues to refuse labs/IV
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
## 2022-01-18 NOTE — ED Notes (Signed)
Patient continues to rest quietly on stretcher in hallway.  Respirations even and unlabored.
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
## 2022-01-18 NOTE — ED Notes (Signed)
Patient resting quietly.  Respirations even and unlabored.  ?
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
## 2022-01-18 NOTE — ED Notes (Signed)
PT ambulated to nurses station.

## 2022-01-18 NOTE — ED Notes (Signed)
Patient ambulatory to restroom. Steady gait noted.
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
## 2022-04-17 ENCOUNTER — Emergency Department (HOSPITAL_COMMUNITY)
Admission: EM | Admit: 2022-04-17 | Discharge: 2022-04-18 | Disposition: A | Discharge status: Home / Self Care | Payer: Self-pay | Attending: Emergency Medicine | Admitting: Emergency Medicine

## 2022-04-17 ENCOUNTER — Encounter (HOSPITAL_COMMUNITY): Payer: Self-pay

## 2022-04-17 DIAGNOSIS — F10921 Alcohol use, unspecified with intoxication delirium: Secondary | ICD-10-CM | POA: Insufficient documentation

## 2022-04-17 DIAGNOSIS — Y908 Blood alcohol level of 240 mg/100 ml or more: Secondary | ICD-10-CM | POA: Insufficient documentation

## 2022-04-17 DIAGNOSIS — Z5900 Homelessness unspecified: Secondary | ICD-10-CM | POA: Insufficient documentation

## 2022-04-17 DIAGNOSIS — F17210 Nicotine dependence, cigarettes, uncomplicated: Secondary | ICD-10-CM | POA: Insufficient documentation

## 2022-04-17 DIAGNOSIS — M7918 Myalgia, other site: Secondary | ICD-10-CM | POA: Insufficient documentation
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
## 2022-04-18 LAB — CBC
HCT: 37.8 % — ABNORMAL LOW (ref 39.0–52.0)
Hemoglobin: 13.2 g/dL (ref 13.0–17.0)
MCH: 34.3 pg — ABNORMAL HIGH (ref 26.0–34.0)
MCHC: 34.9 g/dL (ref 30.0–36.0)
MCV: 98.2 fL (ref 80.0–100.0)
Platelets: 175 10*3/uL (ref 150–400)
RBC: 3.85 MIL/uL — ABNORMAL LOW (ref 4.22–5.81)
RDW: 12.8 % (ref 11.5–15.5)
WBC: 3.9 10*3/uL — ABNORMAL LOW (ref 4.0–10.5)
nRBC: 0 % (ref 0.0–0.2)

## 2022-04-18 LAB — COMPREHENSIVE METABOLIC PANEL
ALT: 35 U/L (ref 0–44)
AST: 62 U/L — ABNORMAL HIGH (ref 15–41)
Albumin: 4.2 g/dL (ref 3.5–5.0)
Alkaline Phosphatase: 72 U/L (ref 38–126)
Anion gap: 13 (ref 5–15)
BUN: 15 mg/dL (ref 6–20)
CO2: 24 mmol/L (ref 22–32)
Calcium: 8.3 mg/dL — ABNORMAL LOW (ref 8.9–10.3)
Chloride: 99 mmol/L (ref 98–111)
Creatinine, Ser: 1.2 mg/dL (ref 0.61–1.24)
GFR, Estimated: >60 mL/min (ref >60)
Glucose, Bld: 80 mg/dL (ref 70–99)
Potassium: 3.6 mmol/L (ref 3.5–5.1)
Sodium: 136 mmol/L (ref 135–145)
Total Bilirubin: 0.5 mg/dL (ref 0.3–1.2)
Total Protein: 7 g/dL (ref 6.5–8.1)

## 2022-04-18 LAB — RAPID URINE DRUG SCREEN, HOSP PERFORMED
Amphetamines: NOT DETECTED
Barbiturates: NOT DETECTED
Benzodiazepines: NOT DETECTED
Cocaine: NOT DETECTED
Opiates: NOT DETECTED
Tetrahydrocannabinol: NOT DETECTED

## 2022-04-18 LAB — ETHANOL: Alcohol, Ethyl (B): 306 mg/dL (ref <10)

## 2022-04-18 LAB — CBG MONITORING, ED: Glucose-Capillary: 157 mg/dL — ABNORMAL HIGH (ref 70–99)

## 2022-04-18 MED ORDER — PANTOPRAZOLE SODIUM 40 MG IV SOLR
40.0000 mg | Freq: Once | INTRAVENOUS | Status: AC
  Administered 2022-04-18: 40 mg via INTRAVENOUS
  Filled 2022-04-18: qty 10

## 2022-04-18 MED ORDER — SODIUM CHLORIDE 0.9 % IV BOLUS
1000.0000 mL | Freq: Once | INTRAVENOUS | Status: AC
  Administered 2022-04-18: 1000 mL via INTRAVENOUS

## 2022-04-18 MED ORDER — ONDANSETRON HCL 4 MG/2ML IJ SOLN
4.0000 mg | Freq: Once | INTRAMUSCULAR | Status: AC
  Administered 2022-04-18: 4 mg via INTRAVENOUS
  Filled 2022-04-18: qty 2

## 2022-04-18 MED ORDER — THIAMINE HCL 100 MG/ML IJ SOLN
100.0000 mg | Freq: Once | INTRAMUSCULAR | Status: AC
  Administered 2022-04-18: 100 mg via INTRAVENOUS
  Filled 2022-04-18: qty 2
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
## 2022-04-18 NOTE — ED Provider Notes (Signed)
  Physical Exam  BP 133/62   Pulse 78   Temp 97.9 F (36.6 C)   Resp 18   Ht 5\' 5"  (1.651 m)   Wt 59 kg   SpO2 100%   BMI 21.63 kg/m     Procedures  Procedures  ED Course / MDM    Medical Decision Making Amount and/or Complexity of Data Reviewed Labs: ordered.  Risk Prescription drug management.   57 year old male presenting to the emergency department with alcohol intoxication.  Had waited in the waiting room overnight and had labs drawn earlier this morning with a significantly elevated alcohol level.  Metabolize to disposition.   The patient was observed for 6 and half hours in the emergency department under my care.  On reassessment, the patient was clinically sober, tolerating oral intake, ambulatory in the emergency department, requesting discharge.  Stable for DC.      Regan Lemming, MD 04/18/22 [phone]

## 2022-04-18 NOTE — ED Provider Notes (Signed)
[facility] DEPT Provider Note: Jay Spurling, MD, FACEP  CSN: [id number] MRN: [id number] ARRIVAL: 04/17/22 at 2217 ROOM: [facility]   CHIEF COMPLAINT  Alcohol Intoxication   HISTORY OF PRESENT ILLNESS  04/18/22 3:56 AM Jay [name] is a 57 y.o. male with a history of alcohol abuse and homelessness.  [place]  EMS reported he smelled heavily of alcohol.  He was complaining of bodyaches but no recent injury.  He states he has been vomiting.  He denies drug use, SI or HI.   Past Medical History:  Diagnosis Date   Alcohol abuse    Homeless     History reviewed. No pertinent surgical history.  History reviewed. No pertinent family history.  Social History   Tobacco Use   Smoking status: Every Day    Types: Cigarettes   Smokeless tobacco: Never  Vaping Use   Vaping Use: Never used  Substance Use Topics   Alcohol use: Yes    Comment: six 24 oz cans of beer daily   Drug use: No    Prior to Admission medications   Not on File    Allergies Patient has no known allergies.   REVIEW OF SYSTEMS  Negative except as noted here or in the History of Present Illness.   PHYSICAL EXAMINATION  Initial Vital Signs Blood pressure (!) 137/98, pulse 87, temperature 97.8 F (36.6 C), temperature source Oral, resp. rate 18, height 5\' 5"  (1.651 m), weight 59 kg, SpO2 98 %.  Examination General: Well-developed, well-nourished male in no acute distress; appearance consistent with age of record HENT: normocephalic; atraumatic Eyes: Normal appearance Neck: supple Heart: regular rate and rhythm Lungs: clear to auscultation bilaterally Abdomen: soft; nondistended; nontender; bowel sounds present Extremities: No deformity; full range of motion; pulses normal Neurologic: Awake, alert; motor function intact in all extremities and symmetric; no facial droop Skin: Warm and dry Psychiatric: Flat affect   RESULTS  Summary  of this visit's results, reviewed and interpreted by myself:   EKG Interpretation  Date/Time:    Ventricular Rate:    PR Interval:    QRS Duration:   QT Interval:    QTC Calculation:   R Axis:     Text Interpretation:         Laboratory Studies: Results for orders placed or performed during the hospital encounter of 04/17/22 (from the past 24 hour(s))  Comprehensive metabolic panel     Status: Abnormal   Collection Time: 04/18/22  3:50 AM  Result Value Ref Range   Sodium 136 135 - 145 mmol/L   Potassium 3.6 3.5 - 5.1 mmol/L   Chloride 99 98 - 111 mmol/L   CO2 24 22 - 32 mmol/L   Glucose, Bld 80 70 - 99 mg/dL   BUN 15 6 - 20 mg/dL   Creatinine, Ser 1.20 0.61 - 1.24 mg/dL   Calcium 8.3 (L) 8.9 - 10.3 mg/dL   Total Protein 7.0 6.5 - 8.1 g/dL   Albumin 4.2 3.5 - 5.0 g/dL   AST 62 (H) 15 - 41 U/L   ALT 35 0 - 44 U/L   Alkaline Phosphatase 72 38 - 126 U/L   Total Bilirubin 0.5 0.3 - 1.2 mg/dL   GFR, Estimated >60 >60 mL/min   Anion gap 13 5 - 15  Ethanol     Status: Abnormal   Collection Time: 04/18/22  3:50 AM  Result Value Ref Range   Alcohol, Ethyl (  B) 306 (HH) <10 mg/dL  cbc     Status: Abnormal   Collection Time: 04/18/22  3:50 AM  Result Value Ref Range   WBC 3.9 (L) 4.0 - 10.5 K/uL   RBC 3.85 (L) 4.22 - 5.81 MIL/uL   Hemoglobin 13.2 13.0 - 17.0 g/dL   HCT 37.8 (L) 39.0 - 52.0 %   MCV 98.2 80.0 - 100.0 fL   MCH 34.3 (H) 26.0 - 34.0 pg   MCHC 34.9 30.0 - 36.0 g/dL   RDW 12.8 11.5 - 15.5 %   Platelets 175 150 - 400 K/uL   nRBC 0.0 0.0 - 0.2 %   Imaging Studies: No results found.  ED COURSE and MDM  Nursing notes, initial and subsequent vitals signs, including pulse oximetry, reviewed and interpreted by myself.  Vitals:   04/17/22 2227 04/17/22 2240 04/18/22 0641  BP: (!) 137/98 (!) 137/98 133/62  Pulse: 87 87 78  Resp: 18  18  Temp: 97.8 F (36.6 C)  97.9 F (36.6 C)  TempSrc: Oral    SpO2: 98%  100%  Weight: 59 kg    Height: 5\' 5"  (1.651 m)      Medications  sodium chloride 0.9 % bolus 1,000 mL (1,000 mLs Intravenous New Bag/Given 04/18/22 0409)  ondansetron (ZOFRAN) injection 4 mg (4 mg Intravenous Given 04/18/22 0409)  pantoprazole (PROTONIX) injection 40 mg (40 mg Intravenous Given 04/18/22 0409)  thiamine (VITAMIN B1) injection 100 mg (100 mg Intravenous Given 04/18/22 0409)   7:24 AM The patient's alcohol level was drawn at 3:50 AM after patient had been in the waiting room for about 5 and half hours.  His alcohol came back at 306 which means his initial alcohol was significantly higher.  He is still somnolent and will need additional time to metabolize his alcohol.  Patient signed out to Dr. Armandina Gemma.   PROCEDURES  Procedures   ED DIAGNOSES     ICD-10-CM   1. Alcohol intoxication with delirium ([place])  [id number]          Shanon Rosser, MD 04/18/22 [phone]

## 2023-01-13 ENCOUNTER — Emergency Department (HOSPITAL_COMMUNITY): Payer: Self-pay

## 2023-01-13 ENCOUNTER — Emergency Department (HOSPITAL_COMMUNITY)
Admission: EM | Admit: 2023-01-13 | Discharge: 2023-01-14 | Disposition: A | Discharge status: Home / Self Care | Payer: Self-pay | Attending: Emergency Medicine | Admitting: Emergency Medicine

## 2023-01-13 ENCOUNTER — Encounter (HOSPITAL_COMMUNITY): Payer: Self-pay

## 2023-01-13 DIAGNOSIS — M79642 Pain in left hand: Secondary | ICD-10-CM | POA: Insufficient documentation

## 2023-01-13 DIAGNOSIS — Y908 Blood alcohol level of 240 mg/100 ml or more: Secondary | ICD-10-CM | POA: Insufficient documentation

## 2023-01-13 DIAGNOSIS — F10120 Alcohol abuse with intoxication, uncomplicated: Secondary | ICD-10-CM | POA: Insufficient documentation

## 2023-01-13 DIAGNOSIS — S0181XA Laceration without foreign body of other part of head, initial encounter: Secondary | ICD-10-CM | POA: Insufficient documentation

## 2023-01-13 DIAGNOSIS — R519 Headache, unspecified: Secondary | ICD-10-CM | POA: Insufficient documentation

## 2023-01-13 DIAGNOSIS — F10920 Alcohol use, unspecified with intoxication, uncomplicated: Secondary | ICD-10-CM

## 2023-01-13 LAB — CBC WITH DIFFERENTIAL/PLATELET
Abs Immature Granulocytes: 0.02 10*3/uL (ref 0.00–0.07)
Basophils Absolute: 0 10*3/uL (ref 0.0–0.1)
Basophils Relative: 0 %
Eosinophils Absolute: 0.1 10*3/uL (ref 0.0–0.5)
Eosinophils Relative: 1 %
HCT: 41.6 % (ref 39.0–52.0)
Hemoglobin: 14.7 g/dL (ref 13.0–17.0)
Immature Granulocytes: 0 %
Lymphocytes Relative: 36 %
Lymphs Abs: 2.1 10*3/uL (ref 0.7–4.0)
MCH: 33.7 pg (ref 26.0–34.0)
MCHC: 35.3 g/dL (ref 30.0–36.0)
MCV: 95.4 fL (ref 80.0–100.0)
Monocytes Absolute: 0.3 10*3/uL (ref 0.1–1.0)
Monocytes Relative: 5 %
Neutro Abs: 3.4 10*3/uL (ref 1.7–7.7)
Neutrophils Relative %: 58 %
Platelets: 252 10*3/uL (ref 150–400)
RBC: 4.36 MIL/uL (ref 4.22–5.81)
RDW: 11.4 % — ABNORMAL LOW (ref 11.5–15.5)
WBC: 5.9 10*3/uL (ref 4.0–10.5)
nRBC: 0 % (ref 0.0–0.2)

## 2023-01-13 LAB — COMPREHENSIVE METABOLIC PANEL
ALT: 53 U/L — ABNORMAL HIGH (ref 0–44)
AST: 110 U/L — ABNORMAL HIGH (ref 15–41)
Albumin: 4.5 g/dL (ref 3.5–5.0)
Alkaline Phosphatase: 58 U/L (ref 38–126)
Anion gap: 17 — ABNORMAL HIGH (ref 5–15)
BUN: 11 mg/dL (ref 6–20)
CO2: 22 mmol/L (ref 22–32)
Calcium: 9.1 mg/dL (ref 8.9–10.3)
Chloride: 102 mmol/L (ref 98–111)
Creatinine, Ser: 1.31 mg/dL — ABNORMAL HIGH (ref 0.61–1.24)
GFR, Estimated: >60 mL/min (ref >60)
Glucose, Bld: 100 mg/dL — ABNORMAL HIGH (ref 70–99)
Potassium: 3.5 mmol/L (ref 3.5–5.1)
Sodium: 141 mmol/L (ref 135–145)
Total Bilirubin: 0.6 mg/dL (ref <1.2)
Total Protein: 7.8 g/dL (ref 6.5–8.1)

## 2023-01-13 LAB — ETHANOL: Alcohol, Ethyl (B): 366 mg/dL (ref <10)
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
## 2023-01-13 NOTE — ED Notes (Signed)
Labs collected at 2252
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
## 2023-01-13 NOTE — ED Notes (Signed)
Patient transported to CT 
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
## 2023-01-14 LAB — RAPID URINE DRUG SCREEN, HOSP PERFORMED
Amphetamines: NOT DETECTED
Barbiturates: NOT DETECTED
Benzodiazepines: NOT DETECTED
Cocaine: NOT DETECTED
Opiates: NOT DETECTED
Tetrahydrocannabinol: NOT DETECTED

## 2023-01-14 NOTE — ED Notes (Signed)
Pt tolerated walk to nurses station and back with minimal assistance and declined drinking water.
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
## 2023-01-14 NOTE — ED Notes (Signed)
Pt states "No ill leave at 10am" when this RN reviewed discharged instructions, educated pt on d/c instructions. Pt then urinated in the floor.

## 2023-01-16 ENCOUNTER — Other Ambulatory Visit: Payer: Self-pay

## 2023-01-16 ENCOUNTER — Encounter (HOSPITAL_COMMUNITY): Payer: Self-pay | Admitting: Emergency Medicine

## 2023-01-16 ENCOUNTER — Emergency Department (HOSPITAL_COMMUNITY)
Admission: EM | Admit: 2023-01-16 | Discharge: 2023-01-16 | Disposition: A | Discharge status: Home / Self Care | Payer: Self-pay | Attending: Emergency Medicine | Admitting: Emergency Medicine

## 2023-01-16 DIAGNOSIS — F10920 Alcohol use, unspecified with intoxication, uncomplicated: Secondary | ICD-10-CM | POA: Insufficient documentation

## 2023-01-16 DIAGNOSIS — Y908 Blood alcohol level of 240 mg/100 ml or more: Secondary | ICD-10-CM | POA: Insufficient documentation

## 2023-01-16 DIAGNOSIS — F1010 Alcohol abuse, uncomplicated: Secondary | ICD-10-CM | POA: Insufficient documentation

## 2023-01-16 LAB — CBC WITH DIFFERENTIAL/PLATELET
Abs Immature Granulocytes: 0 10*3/uL (ref 0.00–0.07)
Basophils Absolute: 0 10*3/uL (ref 0.0–0.1)
Basophils Relative: 1 %
Eosinophils Absolute: 0 10*3/uL (ref 0.0–0.5)
Eosinophils Relative: 1 %
HCT: 41.1 % (ref 39.0–52.0)
Hemoglobin: 14.3 g/dL (ref 13.0–17.0)
Immature Granulocytes: 0 %
Lymphocytes Relative: 48 %
Lymphs Abs: 2 10*3/uL (ref 0.7–4.0)
MCH: 33.4 pg (ref 26.0–34.0)
MCHC: 34.8 g/dL (ref 30.0–36.0)
MCV: 96 fL (ref 80.0–100.0)
Monocytes Absolute: 0.4 10*3/uL (ref 0.1–1.0)
Monocytes Relative: 8 %
Neutro Abs: 1.8 10*3/uL (ref 1.7–7.7)
Neutrophils Relative %: 42 %
Platelets: 247 10*3/uL (ref 150–400)
RBC: 4.28 MIL/uL (ref 4.22–5.81)
RDW: 11.6 % (ref 11.5–15.5)
WBC: 4.2 10*3/uL (ref 4.0–10.5)
nRBC: 0 % (ref 0.0–0.2)

## 2023-01-16 LAB — COMPREHENSIVE METABOLIC PANEL
ALT: 52 U/L — ABNORMAL HIGH (ref 0–44)
AST: 91 U/L — ABNORMAL HIGH (ref 15–41)
Albumin: 4.3 g/dL (ref 3.5–5.0)
Alkaline Phosphatase: 64 U/L (ref 38–126)
Anion gap: 12 (ref 5–15)
BUN: 13 mg/dL (ref 6–20)
CO2: 27 mmol/L (ref 22–32)
Calcium: 8.7 mg/dL — ABNORMAL LOW (ref 8.9–10.3)
Chloride: 101 mmol/L (ref 98–111)
Creatinine, Ser: 1.39 mg/dL — ABNORMAL HIGH (ref 0.61–1.24)
GFR, Estimated: 60 mL/min — ABNORMAL LOW (ref >60)
Glucose, Bld: 104 mg/dL — ABNORMAL HIGH (ref 70–99)
Potassium: 3.8 mmol/L (ref 3.5–5.1)
Sodium: 140 mmol/L (ref 135–145)
Total Bilirubin: 0.6 mg/dL (ref <1.2)
Total Protein: 7.7 g/dL (ref 6.5–8.1)

## 2023-01-16 LAB — ETHANOL: Alcohol, Ethyl (B): 369 mg/dL (ref <10)

## 2023-01-16 MED ORDER — SODIUM CHLORIDE 0.9 % IV BOLUS
1000.0000 mL | Freq: Once | INTRAVENOUS | Status: AC
  Administered 2023-01-16: 1000 mL via INTRAVENOUS
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
## 2023-01-16 NOTE — Discharge Instructions (Addendum)
Usted fue visto hoy por intoxicacin por alcohol. Se le dio tiempo para metabolizar este alcohol y estabilizarse. Regrese a la sala de emergencias si desarrolla sntomas de abstinencia.  You were seen today for alcohol intoxication. You were given time to metabolize this alcohol and stabilize. Please return to the ER if symptoms of withdrawal develop.

## 2023-01-16 NOTE — ED Notes (Signed)
Pt ambulated independently

## 2023-01-16 NOTE — ED Notes (Signed)
Pt eager to leave AMA. Pt encouraged to wait to be discharged by ED provider. Pt refuses. Noted IV has been removed. Pt ambulatory to ED entrance. Steady gait, no LOB noted. Water engineer aware
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
## 2023-01-21 ENCOUNTER — Encounter (HOSPITAL_COMMUNITY): Payer: Self-pay

## 2023-01-21 ENCOUNTER — Other Ambulatory Visit: Payer: Self-pay

## 2023-01-21 ENCOUNTER — Emergency Department (HOSPITAL_COMMUNITY)
Admission: EM | Admit: 2023-01-21 | Discharge: 2023-01-21 | Disposition: A | Discharge status: Home / Self Care | Payer: Self-pay | Attending: Emergency Medicine | Admitting: Emergency Medicine

## 2023-01-21 DIAGNOSIS — X58XXXA Exposure to other specified factors, initial encounter: Secondary | ICD-10-CM | POA: Insufficient documentation

## 2023-01-21 DIAGNOSIS — H7393 Unspecified disorder of tympanic membrane, bilateral: Secondary | ICD-10-CM | POA: Insufficient documentation

## 2023-01-21 DIAGNOSIS — F10920 Alcohol use, unspecified with intoxication, uncomplicated: Secondary | ICD-10-CM | POA: Insufficient documentation

## 2023-01-21 DIAGNOSIS — S0990XA Unspecified injury of head, initial encounter: Secondary | ICD-10-CM | POA: Insufficient documentation

## 2023-01-21 NOTE — Discharge Instructions (Signed)
 1.  Schedule a follow-up appointment with your doctor in 2 to 4 days.  If you do not have a family doctor, use the referral number in your discharge instructions to help find one. 2.  You should seek treatment for alcohol  dependence.  Continued alcohol  use increases risk of serious medical problems and also traumatic injuries.
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
## 2023-02-01 ENCOUNTER — Emergency Department (HOSPITAL_COMMUNITY)
Admission: EM | Admit: 2023-02-01 | Discharge: 2023-02-02 | Discharge status: Against Medical Advice | Payer: Self-pay | Attending: Emergency Medicine | Admitting: Emergency Medicine

## 2023-02-01 ENCOUNTER — Other Ambulatory Visit: Payer: Self-pay

## 2023-02-01 ENCOUNTER — Encounter (HOSPITAL_COMMUNITY): Payer: Self-pay | Admitting: *Deleted

## 2023-02-01 ENCOUNTER — Emergency Department (HOSPITAL_COMMUNITY): Payer: Self-pay

## 2023-02-01 DIAGNOSIS — Y900 Blood alcohol level of less than 20 mg/100 ml: Secondary | ICD-10-CM | POA: Insufficient documentation

## 2023-02-01 DIAGNOSIS — Z5321 Procedure and treatment not carried out due to patient leaving prior to being seen by health care provider: Secondary | ICD-10-CM | POA: Insufficient documentation

## 2023-02-01 DIAGNOSIS — S80211A Abrasion, right knee, initial encounter: Secondary | ICD-10-CM | POA: Insufficient documentation

## 2023-02-01 DIAGNOSIS — X58XXXA Exposure to other specified factors, initial encounter: Secondary | ICD-10-CM | POA: Insufficient documentation

## 2023-02-01 DIAGNOSIS — L02415 Cutaneous abscess of right lower limb: Secondary | ICD-10-CM | POA: Insufficient documentation

## 2023-02-01 LAB — CBC WITH DIFFERENTIAL/PLATELET
Abs Immature Granulocytes: 0.05 10*3/uL (ref 0.00–0.07)
Basophils Absolute: 0.1 10*3/uL (ref 0.0–0.1)
Basophils Relative: 1 %
Eosinophils Absolute: 0.1 10*3/uL (ref 0.0–0.5)
Eosinophils Relative: 1 %
HCT: 36.1 % — ABNORMAL LOW (ref 39.0–52.0)
Hemoglobin: 12.6 g/dL — ABNORMAL LOW (ref 13.0–17.0)
Immature Granulocytes: 1 %
Lymphocytes Relative: 26 %
Lymphs Abs: 1.7 10*3/uL (ref 0.7–4.0)
MCH: 34 pg (ref 26.0–34.0)
MCHC: 34.9 g/dL (ref 30.0–36.0)
MCV: 97.3 fL (ref 80.0–100.0)
Monocytes Absolute: 0.7 10*3/uL (ref 0.1–1.0)
Monocytes Relative: 11 %
Neutro Abs: 4 10*3/uL (ref 1.7–7.7)
Neutrophils Relative %: 60 %
Platelets: 345 10*3/uL (ref 150–400)
RBC: 3.71 MIL/uL — ABNORMAL LOW (ref 4.22–5.81)
RDW: 12.3 % (ref 11.5–15.5)
WBC: 6.5 10*3/uL (ref 4.0–10.5)
nRBC: 0 % (ref 0.0–0.2)

## 2023-02-01 LAB — BASIC METABOLIC PANEL
Anion gap: 15 (ref 5–15)
BUN: 13 mg/dL (ref 6–20)
CO2: 19 mmol/L — ABNORMAL LOW (ref 22–32)
Calcium: 8.9 mg/dL (ref 8.9–10.3)
Chloride: 98 mmol/L (ref 98–111)
Creatinine, Ser: 1.19 mg/dL (ref 0.61–1.24)
GFR, Estimated: >60 mL/min (ref >60)
Glucose, Bld: 90 mg/dL (ref 70–99)
Potassium: 3.3 mmol/L — ABNORMAL LOW (ref 3.5–5.1)
Sodium: 132 mmol/L — ABNORMAL LOW (ref 135–145)

## 2023-02-01 LAB — I-STAT CG4 LACTIC ACID, ED: Lactic Acid, Venous: 2.7 mmol/L (ref 0.5–1.9)

## 2023-02-01 LAB — ETHANOL: Alcohol, Ethyl (B): 350 mg/dL (ref <10)

## 2023-02-01 NOTE — ED Triage Notes (Signed)
 Pt arrives via [facility]. Picked up from the laundry mat. C/o right leg pain, seen recently for the same. Pain for about 4 days. ETOH. En route 160/100, 96hr, cbg 120, 20 rr.

## 2023-02-01 NOTE — ED Triage Notes (Signed)
 Via spanish interpreter, pt says he has an infection in his right leg. He says he fell about 6 days ago. Pt has large abrasion to the right knee, redness and swelling to the right leg. Small amount of clear drainage from a small wound right anterior knee. ETOH tonight.

## 2023-02-01 NOTE — ED Provider Triage Note (Signed)
 Emergency Medicine Provider Triage Evaluation Note  Jay [name] , a 58 y.o. male  was evaluated in triage.  Pt complains of pain to right knee, has wound to knee.  No fever.  Review of Systems  Positive: Knee pain, swelling, drainage,  Negative: fever  Physical Exam  BP (!) 141/98 (BP Location: Right Arm)   Pulse 100   Temp 98 F (36.7 C) (Oral)   Resp 16   SpO2 99%  Gen:   Awake, no distress    Resp:  Normal effort   MSK:   Moves extremities without difficulty   Other:  Abrasion to right knee with abscess, purulent drainage, surrounding redness  Medical Decision Making  Medically screening exam initiated at 10:33 PM.  Appropriate orders placed.  Jay [name] was informed that the remainder of the evaluation will be completed by another provider, this initial triage assessment does not replace that evaluation, and the importance of remaining in the ED until their evaluation is complete.      Lenor Hollering, MD 02/01/23 2234

## 2023-02-02 ENCOUNTER — Emergency Department (HOSPITAL_COMMUNITY): Payer: Self-pay

## 2023-02-02 LAB — I-STAT CG4 LACTIC ACID, ED: Lactic Acid, Venous: 1.6 mmol/L (ref 0.5–1.9)
